# Patient Record
Sex: Female | Born: 1960
Health system: Southern US, Community
[De-identification: ages and names within clinical notes are randomized; demographics above are authoritative.]

## PROBLEM LIST (undated history)

## (undated) DIAGNOSIS — M1990 Unspecified osteoarthritis, unspecified site: Secondary | ICD-10-CM

## (undated) DIAGNOSIS — M2550 Pain in unspecified joint: Secondary | ICD-10-CM

## (undated) DIAGNOSIS — K219 Gastro-esophageal reflux disease without esophagitis: Secondary | ICD-10-CM

## (undated) DIAGNOSIS — E785 Hyperlipidemia, unspecified: Secondary | ICD-10-CM

## (undated) DIAGNOSIS — K589 Irritable bowel syndrome without diarrhea: Secondary | ICD-10-CM

## (undated) DIAGNOSIS — F419 Anxiety disorder, unspecified: Secondary | ICD-10-CM

## (undated) DIAGNOSIS — R7303 Prediabetes: Secondary | ICD-10-CM

## (undated) DIAGNOSIS — I10 Essential (primary) hypertension: Secondary | ICD-10-CM

## (undated) DIAGNOSIS — D219 Benign neoplasm of connective and other soft tissue, unspecified: Secondary | ICD-10-CM

## (undated) HISTORY — DX: Irritable bowel syndrome, unspecified: K58.9

## (undated) HISTORY — DX: Gastro-esophageal reflux disease without esophagitis: K21.9

## (undated) HISTORY — PX: WISDOM TOOTH EXTRACTION: SHX21

## (undated) HISTORY — DX: Anxiety disorder, unspecified: F41.9

## (undated) HISTORY — DX: Essential (primary) hypertension: I10

## (undated) HISTORY — PX: COLONOSCOPY: SHX174

## (undated) HISTORY — PX: EYE SURGERY: SHX253

## (undated) HISTORY — DX: Unspecified osteoarthritis, unspecified site: M19.90

## (undated) HISTORY — DX: Hyperlipidemia, unspecified: E78.5

## (undated) HISTORY — DX: Pain in unspecified joint: M25.50

## (undated) HISTORY — PX: UMBILICAL HERNIA REPAIR: SHX196

## (undated) HISTORY — PX: KNEE SURGERY: SHX244

## (undated) HISTORY — PX: TONSILLECTOMY: SUR1361

## (undated) HISTORY — PX: KNEE ARTHROSCOPY: SHX127

## (undated) HISTORY — DX: Prediabetes: R73.03

## (undated) HISTORY — DX: Benign neoplasm of connective and other soft tissue, unspecified: D21.9

---

## 2011-02-06 DIAGNOSIS — K219 Gastro-esophageal reflux disease without esophagitis: Secondary | ICD-10-CM | POA: Insufficient documentation

## 2014-12-28 DIAGNOSIS — M1990 Unspecified osteoarthritis, unspecified site: Secondary | ICD-10-CM | POA: Insufficient documentation

## 2014-12-29 DIAGNOSIS — K581 Irritable bowel syndrome with constipation: Secondary | ICD-10-CM | POA: Insufficient documentation

## 2015-01-01 DIAGNOSIS — E785 Hyperlipidemia, unspecified: Secondary | ICD-10-CM | POA: Insufficient documentation

## 2015-05-09 MED FILL — LINZESS 290 MCG CAPSULE: 290 | 30 days supply | Qty: 30 | Fill #0

## 2015-05-09 MED FILL — OMEPRAZOLE DR 20 MG CAPSULE: 20 | 90 days supply | Qty: 180 | Fill #0

## 2015-06-20 MED FILL — LINZESS 290 MCG CAPSULE: 290 | 30 days supply | Qty: 30 | Fill #1

## 2015-07-12 ENCOUNTER — Telehealth: Payer: Self-pay | Admitting: Gastroenterology

## 2015-07-31 MED FILL — LINZESS 290 MCG CAPSULE: 290 | 30 days supply | Qty: 30 | Fill #0

## 2015-08-03 NOTE — Telephone Encounter (Signed)
Received records. Pt is requesting Dr Silverio Decamp. Would like to be seen for a office visit. Pt relocated to this area.

## 2015-08-08 ENCOUNTER — Encounter: Payer: Self-pay | Admitting: Gastroenterology

## 2015-08-08 NOTE — Telephone Encounter (Signed)
Dr. Nandigam reviewed records and has accepted patient. Ok to schedule Direct Colon. Left message for patient to return my call.  °

## 2015-08-08 NOTE — Telephone Encounter (Signed)
Appointment scheduled.

## 2015-08-14 ENCOUNTER — Encounter: Payer: Self-pay | Admitting: Obstetrics and Gynecology

## 2015-08-23 ENCOUNTER — Ambulatory Visit (INDEPENDENT_AMBULATORY_CARE_PROVIDER_SITE_OTHER): Payer: 59 | Admitting: Obstetrics and Gynecology

## 2015-08-23 ENCOUNTER — Encounter: Payer: Self-pay | Admitting: Obstetrics and Gynecology

## 2015-08-23 VITALS — BP 122/80 | HR 80 | Resp 14 | Ht 64.5 in | Wt 228.0 lb

## 2015-08-23 DIAGNOSIS — Z91048 Other nonmedicinal substance allergy status: Secondary | ICD-10-CM

## 2015-08-23 DIAGNOSIS — Z9109 Other allergy status, other than to drugs and biological substances: Secondary | ICD-10-CM | POA: Insufficient documentation

## 2015-08-23 DIAGNOSIS — Z01419 Encounter for gynecological examination (general) (routine) without abnormal findings: Secondary | ICD-10-CM

## 2015-08-23 DIAGNOSIS — F411 Generalized anxiety disorder: Secondary | ICD-10-CM | POA: Insufficient documentation

## 2015-08-23 MED ORDER — CITALOPRAM HYDROBROMIDE 20 MG PO TABS: ORAL_TABLET | ORAL | 1 refills | Status: DC

## 2015-08-23 MED FILL — CITALOPRAM HBR 20 MG TABLET: 20 | 30 days supply | Qty: 30 | Fill #0

## 2015-08-23 NOTE — Patient Instructions (Signed)

## 2015-08-23 NOTE — Progress Notes (Signed)
55 y.o. [id number] MarriedAfrican AmericanF here for annual exam.  Perimenopausal, almost a year since her last cycle. No spotting. No vasomotor symptoms. She has a h/o a fibroid, she doesn't think her uterus is enlarged. Infrequently sexually active, husband has MS and ED. No vaginal dryness.  She has some anxiety, maybe has ADD (will discuss with her primary). Yesterday she had an allergic reaction to something, eyes swelled up. She took benadryl helped. In the past she was told she had allergies to mold    Patient's last menstrual period was 10/07/2014 (approximate).          Sexually active: Yes.    The current method of family planning is none.    Exercising: Yes.    just started- walking/stairs Smoker:  Former smoker  Health Maintenance: Pap:  10/19/14, negative, negative hpv History of abnormal Pap:  no MMG:  03/2015 WNL per patient ([place])  Colonoscopy:  2014 polyp - repeat in 5 years BMD:   Never TDaP:  Up to date  Gardasil: N/A   reports that she has quit smoking. Her smoking use included Cigarettes. She has a 4.00 pack-year smoking history. She has never used smokeless tobacco. She reports that she does not drink alcohol or use drugs. She works for [employer], in [place]. She moved here from [place]. 2 grown kids, one in [place], one in [place]. 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 Both musicians.  Former Licensed conveyancer.  Husband is a MD in [employer]  Past Medical History:  Diagnosis Date  . Acid reflux   . Fibroid   . Gastroesophageal reflux   . Hypertension   . IBS (irritable bowel syndrome)   . Osteoarthritis    both knees    Past Surgical History:  Procedure Laterality Date  . CESAREAN SECTION    . EYE SURGERY Right   . KNEE ARTHROSCOPY Right   . UMBILICAL HERNIA REPAIR    . WISDOM TOOTH EXTRACTION      Current Outpatient Prescriptions  Medication Sig Dispense Refill  . LINZESS 290 MCG CAPS capsule   4  . omeprazole (PRILOSEC) 20 MG capsule Take by mouth.     No current  facility-administered medications for this visit.     Family History  Problem Relation Age of Onset  . Hyperlipidemia Mother   . Hypertension Mother   . Stroke Sister   . Heart disease Maternal Grandmother   She had HTN in her last job, improved with reduction in stress.   Review of Systems  Constitutional: Negative.   HENT: Negative.   Eyes: Negative.   Respiratory: Negative.   Cardiovascular: Negative.   Gastrointestinal: Negative.   Endocrine: Negative.   Genitourinary: Negative.   Musculoskeletal: Negative.   Skin: Negative.   Allergic/Immunologic: Negative.   Neurological: Negative.   Psychiatric/Behavioral: Negative.     Exam:   BP 122/80 (BP Location: Right Arm, Patient Position: Sitting, Cuff Size: Normal)   Pulse 80   Resp 14   Ht 5' 4.5" (1.638 m)   Wt 228 lb (103.4 kg)   LMP 10/07/2014 (Approximate)   BMI 38.53 kg/m   Weight change: @WEIGHTCHANGE @ Height:   Height: 5' 4.5" (163.8 cm)  Ht Readings from Last 3 Encounters:  08/23/15 5' 4.5" (1.638 m)    General appearance: alert, cooperative and appears stated age Head: Normocephalic, without obvious abnormality, atraumatic Neck: no adenopathy, supple, symmetrical, trachea midline and thyroid normal to inspection and palpation Lungs: clear to auscultation bilaterally Breasts: normal appearance, no masses or  tenderness Heart: regular rate and rhythm Abdomen: soft, non-tender; bowel sounds normal; no masses,  no organomegaly Extremities: extremities normal, atraumatic, no cyanosis or edema Skin: Skin color, texture, turgor normal. No rashes or lesions Lymph nodes: Cervical, supraclavicular, and axillary nodes normal. No abnormal inguinal nodes palpated Neurologic: Grossly normal   Pelvic: External genitalia:  no lesions              Urethra:  normal appearing urethra with no masses, tenderness or lesions              Bartholins and Skenes: normal                 Vagina: normal appearing vagina with  normal color and discharge, no lesions              Cervix: no lesions               Bimanual Exam:  Uterus:  normal size, contour, position, consistency, mobility, non-tender              Adnexa: no mass, fullness, tenderness               Rectovaginal: Confirms               Anus:  normal sphincter tone, no lesions  Chaperone was present for exam.  A:  Well Woman with normal exam  Pre-diabetes, f/u with primary  Anxiety  P:   No pap this year  Mammogram UTD  Colonoscopy UTD  Discussed breast self exam  Discussed calcium and vit D intake  She is due for lab work with her primary in 12/17  Discussed options of counseling or medication, she would like medication  Start Celexa, f/u in one month

## 2015-08-24 ENCOUNTER — Encounter: Payer: Self-pay | Admitting: Obstetrics and Gynecology

## 2015-08-24 NOTE — Telephone Encounter (Signed)
Routing to provider for final review. Will close encounter.     

## 2015-09-17 ENCOUNTER — Telehealth: Payer: Self-pay | Admitting: Obstetrics and Gynecology

## 2015-09-17 NOTE — Telephone Encounter (Signed)
Patient cancelled 4 week fu appointment and did not wish to reschedule at this time.

## 2015-09-20 ENCOUNTER — Ambulatory Visit: Payer: 59 | Admitting: Obstetrics and Gynecology

## 2015-10-05 ENCOUNTER — Encounter: Payer: Self-pay | Admitting: Nurse Practitioner

## 2015-10-05 ENCOUNTER — Ambulatory Visit (INDEPENDENT_AMBULATORY_CARE_PROVIDER_SITE_OTHER): Payer: 59 | Admitting: Nurse Practitioner

## 2015-10-05 ENCOUNTER — Other Ambulatory Visit (INDEPENDENT_AMBULATORY_CARE_PROVIDER_SITE_OTHER): Payer: 59

## 2015-10-05 VITALS — BP 110/84 | HR 57 | Temp 98.7°F | Resp 16 | Ht 64.0 in | Wt 221.0 lb

## 2015-10-05 DIAGNOSIS — Z0000 Encounter for general adult medical examination without abnormal findings: Secondary | ICD-10-CM

## 2015-10-05 DIAGNOSIS — R739 Hyperglycemia, unspecified: Secondary | ICD-10-CM

## 2015-10-05 DIAGNOSIS — K219 Gastro-esophageal reflux disease without esophagitis: Secondary | ICD-10-CM | POA: Diagnosis not present

## 2015-10-05 DIAGNOSIS — K581 Irritable bowel syndrome with constipation: Secondary | ICD-10-CM

## 2015-10-05 LAB — LIPID PANEL
CHOL/HDL RATIO: 5
CHOLESTEROL: 201 mg/dL — AB (ref 0–200)
HDL: 40.2 mg/dL (ref >39.00)
LDL Cholesterol: 134 mg/dL — ABNORMAL HIGH (ref 0–99)
NonHDL: 160.52
TRIGLYCERIDES: 131 mg/dL (ref 0.0–149.0)
VLDL: 26.2 mg/dL (ref 0.0–40.0)

## 2015-10-05 LAB — BASIC METABOLIC PANEL
BUN: 19 mg/dL (ref 6–23)
CO2: 29 meq/L (ref 19–32)
Calcium: 8.6 mg/dL (ref 8.4–10.5)
Chloride: 106 mEq/L (ref 96–112)
Creatinine, Ser: 0.99 mg/dL (ref 0.40–1.20)
GFR: 62.21 mL/min (ref >60.00)
GLUCOSE: 92 mg/dL (ref 70–99)
POTASSIUM: 4.1 meq/L (ref 3.5–5.1)
SODIUM: 140 meq/L (ref 135–145)

## 2015-10-05 LAB — HEMOGLOBIN A1C: Hgb A1c MFr Bld: 5.9 % (ref 4.6–6.5)

## 2015-10-05 NOTE — Progress Notes (Signed)
Pre visit review using our clinic review tool, if applicable. No additional management support is needed unless otherwise documented below in the visit note. 

## 2015-10-05 NOTE — Progress Notes (Signed)
Subjective:    Patient ID: Elizabeth Brooks, female    DOB: [birth year], 55 y.o.   MRN: [id number]  Patient presents today for complete physical or establish care (new patient)  HPI  GERD: Controlled symptoms with omeprazole.    IBS-D: Uses linzess prn, bowel routine has improved with increase exercise and healthy diet/  Immunizations: (TDAP, Hep C screen, Pneumovax, Influenza, zoster)  Health Maintenance  Topic Date Due  . Colon Cancer Screening  02/16/2012  . Flu Shot  04/06/2016*  .  Hepatitis C: One time screening is recommended by Center for Disease Control  (CDC) for  adults born from [birth year] through 1965.   10/03/2017*  . HIV Screening  10/03/2017*  . Mammogram  10/23/2016  . Pap Smear  10/18/2017  . Tetanus Vaccine  01/14/2021  *Topic was postponed. The date shown is not the original due date.   Diet: started healthy diet Weight:  Wt Readings from Last 3 Encounters:  10/05/15 221 lb (100.2 kg)  08/23/15 228 lb (103.4 kg)   Exercise:walking daily Home Safety: home with husband. Depression/Suicide:denies No flowsheet data found. No flowsheet data found. Colonoscopy (every 5-84yrs, >50-38yrs):up to date, will bring records Pap Smear (every 33yrs for >21-29 without HPV, every 102yrs for >30-52yrs with HPV):up to date Mammogram (yearly, >65yrs): due next month, patient will schedule Vision:up to date Dental:up to date Sexual History (birth control, marital status, STD):married, has 2adult children, sexually active with husband, menopausal.  Medications and allergies reviewed with patient and updated if appropriate.  Patient Active Problem List   Diagnosis Date Noted  . Generalized anxiety disorder 08/23/2015  . Environmental allergies 08/23/2015  . Irritable bowel syndrome with constipation 12/29/2014  . Osteoarthritis 12/28/2014  . GERD (gastroesophageal reflux disease) 02/06/2011    Current Outpatient Prescriptions on File Prior to Visit  Medication Sig Dispense  Refill  . LINZESS 290 MCG CAPS capsule   4  . omeprazole (PRILOSEC) 20 MG capsule Take by mouth.     No current facility-administered medications on file prior to visit.     Past Medical History:  Diagnosis Date  . Acid reflux   . Fibroid   . Gastroesophageal reflux   . Hypertension   . IBS (irritable bowel syndrome)   . Osteoarthritis    both knees  . Prediabetes     Past Surgical History:  Procedure Laterality Date  . CESAREAN SECTION    . EYE SURGERY Right   . KNEE ARTHROSCOPY Right   . UMBILICAL HERNIA REPAIR    . WISDOM TOOTH EXTRACTION      Social History   Social History  . Marital status: Married    Spouse name: N/A  . Number of children: N/A  . Years of education: N/A   Social History Main Topics  . Smoking status: Former Smoker    Packs/day: 1.00    Years: 4.00    Types: Cigarettes  . Smokeless tobacco: Never Used  . Alcohol use No  . Drug use: No  . Sexual activity: Yes    Partners: Male    Birth control/ protection: None   Other Topics Concern  . None   Social History Narrative  . None    Family History  Problem Relation Age of Onset  . Hyperlipidemia Mother   . Hypertension Mother   . Stroke Sister   . Heart disease Maternal Grandmother         Review of Systems  Constitutional: Negative for fever, malaise/fatigue and weight loss.  HENT: Negative for congestion and sore throat.   Eyes:       Negative for visual changes  Respiratory: Negative for cough and shortness of breath.   Cardiovascular: Negative for chest pain, palpitations and leg swelling.  Gastrointestinal: Negative for blood in stool, constipation, diarrhea and heartburn.  Genitourinary: Negative for dysuria, frequency and urgency.  Musculoskeletal: Negative for falls, joint pain and myalgias.  Skin: Negative for rash.  Neurological: Negative for dizziness, sensory change and headaches.  Endo/Heme/Allergies: Does not bruise/bleed easily.  Psychiatric/Behavioral:  Negative for depression, substance abuse and suicidal ideas. The patient is not nervous/anxious.     Objective:   Vitals:   10/05/15 0916  BP: 110/84  Pulse: (!) 57  Resp: 16  Temp: 98.7 F (37.1 C)    Body mass index is 37.93 kg/m.   Physical Examination:  Physical Exam  Constitutional: She is oriented to person, place, and time and well-developed, well-nourished, and in no distress. No distress.  HENT:  Right Ear: External ear normal.  Left Ear: External ear normal.  Nose: Nose normal.  Mouth/Throat: No oropharyngeal exudate.  Eyes: Conjunctivae and EOM are normal. Pupils are equal, round, and reactive to light. No scleral icterus.  Neck: Normal range of motion. Neck supple. No thyromegaly present.  Cardiovascular: Normal rate, regular rhythm, normal heart sounds and intact distal pulses.   Pulmonary/Chest: Effort normal and breath sounds normal.  Abdominal: Soft. Bowel sounds are normal. She exhibits no distension. There is no tenderness.  Musculoskeletal: Normal range of motion. She exhibits no edema or tenderness.  Lymphadenopathy:    She has no cervical adenopathy.  Neurological: She is alert and oriented to person, place, and time. Gait normal.  Skin: Skin is warm and dry.  Psychiatric: Affect and judgment normal.  Vitals reviewed.   ASSESSMENT and PLAN:  Elizabeth Brooks was seen today for establish care.  Diagnoses and all orders for this visit:  Encounter for preventive health examination -     Lipid panel -     Basic Metabolic Panel (BMET); Future  Irritable bowel syndrome with constipation  Gastroesophageal reflux disease without esophagitis  Hyperglycemia -     Hemoglobin A1c; Future    No problem-specific Assessment & Plan notes found for this encounter.     Follow up: Return in about 1 year (around 10/04/2016) for CPE.  Wilfred Lacy, NP

## 2015-10-08 NOTE — Progress Notes (Signed)
Normal BMP. Hemoglobin A1c indicates prediabetes. Cholesterol panel indicates elevated total cholesterol and LDL. Encourage heart healthy diet and regular daily exercise. Return to office in 6 months instead of one year.

## 2015-10-17 NOTE — Progress Notes (Signed)
[facility] [address] [place], [place] [address] Phone: [phone] Subjective:    I'm seeing this patient by the request  of:  Wilfred Lacy, NP   CC: Bilateral knee pain  [id number]  Elizabeth Brooks is a 55 y.o. female coming in with complaint of bilateral knee pain. Patient states that this is been a long acting problem. Has been told that she has osteophytic changes of the knees bilaterally. Patient states she is seen other providers for this previously. Patient though is looking to be more active. Likes to run. Finds that the knee pain is severe afterwards. States that since mostly on the anterior aspect of the knees. Denies any giving out on her or any instability. States that it is more of a soreness. Rates the severity of pain a 6 out of 10. Has been walking more frequently and even this activity seems to get some discomfort.     Past Medical History:  Diagnosis Date  . Acid reflux   . Fibroid   . Gastroesophageal reflux   . Hypertension   . IBS (irritable bowel syndrome)   . Osteoarthritis    both knees  . Prediabetes    Past Surgical History:  Procedure Laterality Date  . CESAREAN SECTION    . EYE SURGERY Right   . KNEE ARTHROSCOPY Right   . UMBILICAL HERNIA REPAIR    . WISDOM TOOTH EXTRACTION     Social History   Social History  . Marital status: Married    Spouse name: N/A  . Number of children: N/A  . Years of education: N/A   Social History Main Topics  . Smoking status: Former Smoker    Packs/day: 1.00    Years: 4.00    Types: Cigarettes  . Smokeless tobacco: Never Used  . Alcohol use No  . Drug use: No  . Sexual activity: Yes    Partners: Male    Birth control/ protection: None   Other Topics Concern  . None   Social History Narrative  . None   Allergies  Allergen Reactions  . Penicillins Anaphylaxis and Rash   Family History  Problem Relation Age of Onset  . Hyperlipidemia Mother   . Hypertension  Mother   . Stroke Sister   . Heart disease Maternal Grandmother     Past medical history, social, surgical and family history all reviewed in electronic medical record.  No pertanent information unless stated regarding to the chief complaint.   Review of Systems: No headache, visual changes, nausea, vomiting, diarrhea, constipation, dizziness, abdominal pain, skin rash, fevers, chills, night sweats, weight loss, swollen lymph nodes, body aches, joint swelling, muscle aches, chest pain, shortness of breath, mood changes.   Objective  Blood pressure 126/80, pulse 62, weight 220 lb (99.8 kg), SpO2 97 %.  General: No apparent distress alert and oriented x3 mood and affect normal, dressed appropriately.  HEENT: Pupils equal, extraocular movements intact  Respiratory: Patient's speak in full sentences and does not appear short of breath  Cardiovascular: No lower extremity edema, non tender, no erythema  Skin: Warm dry intact with no signs of infection or rash on extremities or on axial skeleton.  Abdomen: Soft nontender  Neuro: Cranial nerves II through XII are intact, neurovascularly intact in all extremities with 2+ DTRs and 2+ pulses.  Lymph: No lymphadenopathy of posterior or anterior cervical chain or axillae bilaterally.  Gait normal with good balance and coordination.  MSK:  Non tender with  full range of motion and good stability and symmetric strength and tone of shoulders, elbows, wrist, hip and ankles bilaterally.  Knee: bilateral. Mild valgus deformity of the knees bilaterally right greater than left Tender to palpation mostly over the patellofemoral ROM full in flexion and extension and lower leg rotation. Ligaments with solid consistent endpoints including ACL, PCL, LCL, MCL. Negative Mcmurray's, Apley's, and Thessalonian tests. painful patellar compression. Patellar glide with mild crepitus. Patellar and quadriceps tendons unremarkable. Hamstring and quadriceps strength is  normal.  Severe pes planus with overpronation of the foot left greater than right.  Procedure note [id number]; 15 minutes spent for Therapeutic exercises as stated in above notes.  This included exercises focusing on stretching, strengthening, with significant focus on eccentric aspects. Patellofemoral Syndrome  Reviewed anatomy using anatomical model and how PFS occurs.  Given rehab exercises handout for VMO, hip abductors, core, entire kinetic chain including proprioception exercises including cone touches, step downs, hip elevations and turn outs.  Could benefit from PT, regular exercise, upright biking, and a PFS knee brace to assist with tracking abnormalities.   Proper technique shown and discussed handout in great detail with ATC.  All questions were discussed and answered.     Impression and Recommendations:     This case required medical decision making of moderate complexity.      Note: This dictation was prepared with Dragon dictation along with smaller phrase technology. Any transcriptional errors that result from this process are unintentional.

## 2015-10-18 ENCOUNTER — Encounter: Payer: Self-pay | Admitting: Family Medicine

## 2015-10-18 ENCOUNTER — Ambulatory Visit (INDEPENDENT_AMBULATORY_CARE_PROVIDER_SITE_OTHER): Payer: 59 | Admitting: Family Medicine

## 2015-10-18 DIAGNOSIS — M222X2 Patellofemoral disorders, left knee: Secondary | ICD-10-CM | POA: Diagnosis not present

## 2015-10-18 DIAGNOSIS — M222X1 Patellofemoral disorders, right knee: Secondary | ICD-10-CM | POA: Diagnosis not present

## 2015-10-18 DIAGNOSIS — M2141 Flat foot [pes planus] (acquired), right foot: Secondary | ICD-10-CM | POA: Diagnosis not present

## 2015-10-18 DIAGNOSIS — M2142 Flat foot [pes planus] (acquired), left foot: Secondary | ICD-10-CM | POA: Diagnosis not present

## 2015-10-18 MED ORDER — DICLOFENAC SODIUM 2 % TD SOLN: 2.0000 "application " | Freq: Two times a day (BID) | TRANSDERMAL | 3 refills | Status: DC

## 2015-10-18 NOTE — Patient Instructions (Signed)
Good to see you  Ice 20 minutes 2 times daily. Usually after activity and before bed. pennsaid pinkie amount topically 2 times daily as needed.  Spenco orthotics "total support" online would be great  Good shoes with rigid bottom.  Elizabeth Brooks, Merrell or New balance greater then 700, also consider allergia or xerelo.  Vitamin D 2000 IU daily  Turmeric 500mg  twice daily Tart cherry extract at night See me again in 4 weeks.

## 2015-10-19 ENCOUNTER — Encounter: Payer: Self-pay | Admitting: Nurse Practitioner

## 2015-10-19 DIAGNOSIS — M2140 Flat foot [pes planus] (acquired), unspecified foot: Secondary | ICD-10-CM | POA: Insufficient documentation

## 2015-10-19 DIAGNOSIS — M222X1 Patellofemoral disorders, right knee: Secondary | ICD-10-CM | POA: Insufficient documentation

## 2015-10-19 DIAGNOSIS — M222X2 Patellofemoral disorders, left knee: Secondary | ICD-10-CM | POA: Insufficient documentation

## 2015-10-19 NOTE — Assessment & Plan Note (Signed)
Recommended over-the-counter orthotics. Patient may be a candidate for custom orthotics if necessary.

## 2015-10-19 NOTE — Assessment & Plan Note (Signed)
Patient does have patellofemoral syndrome bilaterally. We discussed with patient about the mild instability of the knees as well. Patient will do more crosstraining, given home exercises with athletic trainer, we discussed icing regimen. Discussed proper shoes that I think will also help with alignment. Patient come back again in 4 weeks and if worsening symptoms we'll consider injection and formal physical therapy.

## 2015-10-25 ENCOUNTER — Other Ambulatory Visit: Payer: Self-pay | Admitting: Obstetrics and Gynecology

## 2015-10-25 DIAGNOSIS — Z1231 Encounter for screening mammogram for malignant neoplasm of breast: Secondary | ICD-10-CM

## 2015-10-29 ENCOUNTER — Telehealth: Payer: Self-pay | Admitting: Gastroenterology

## 2015-10-29 ENCOUNTER — Ambulatory Visit: Payer: Self-pay | Admitting: Gastroenterology

## 2015-10-29 NOTE — Telephone Encounter (Signed)
No charge. 

## 2015-10-31 ENCOUNTER — Encounter: Payer: Self-pay | Admitting: Gastroenterology

## 2015-10-31 ENCOUNTER — Ambulatory Visit (INDEPENDENT_AMBULATORY_CARE_PROVIDER_SITE_OTHER): Payer: 59 | Admitting: Gastroenterology

## 2015-10-31 VITALS — BP 118/76 | HR 80 | Ht 64.0 in | Wt 219.0 lb

## 2015-10-31 DIAGNOSIS — K5900 Constipation, unspecified: Secondary | ICD-10-CM

## 2015-10-31 DIAGNOSIS — D219 Benign neoplasm of connective and other soft tissue, unspecified: Secondary | ICD-10-CM | POA: Insufficient documentation

## 2015-10-31 DIAGNOSIS — K219 Gastro-esophageal reflux disease without esophagitis: Secondary | ICD-10-CM

## 2015-10-31 MED ORDER — LINACLOTIDE 72 MCG PO CAPS: 72.0000 ug | ORAL_CAPSULE | Freq: Every day | ORAL | 3 refills | Status: DC

## 2015-10-31 MED ORDER — FAMOTIDINE 40 MG PO TABS: 40.0000 mg | ORAL_TABLET | Freq: Every day | ORAL | 6 refills | Status: DC

## 2015-10-31 MED ORDER — OMEPRAZOLE 20 MG PO CPDR: 20.0000 mg | DELAYED_RELEASE_CAPSULE | Freq: Two times a day (BID) | ORAL | 3 refills | Status: DC

## 2015-10-31 MED FILL — OMEPRAZOLE DR 20 MG CAPSULE: 20 | 90 days supply | Qty: 180 | Fill #0

## 2015-10-31 MED FILL — LINZESS 72 MCG CAPSULE: 72 | 30 days supply | Qty: 30 | Fill #0

## 2015-10-31 NOTE — Patient Instructions (Signed)
Your recall colonoscopy will be in July 2019, we will send out a reminder letter  We have refilled Omeprazole today   We have sent Linzess and Pepcid to your pharmacy today  Use Benefiber 1 tablespoon three times a day with meals  Follow up in 3-6 months

## 2015-11-02 NOTE — Progress Notes (Signed)
Elizabeth Brooks    [id number]    10-24-1960  Primary Care Physician:Charlotte [name], NP  Referring Physician: Flossie Buffy, NP [place] [phone] [place], [place] [address]  Chief complaint: GERD, surveillance colonsocopy  HPI: 55 yr F recently moved to this are is here to establish care. Last colonoscopy in July 2014 with removal 3 mm polyp in ascending colon, was lymphoid aggregate, no evidence of adenoma or dysplasia. She also has small internal hemorrhoids. Patient has history of chronic GERD on omeprazole with good symptom control with occasional break through at bedtime. She also has history of chronic constipation was prescribed Linzess 290 g by PMD but patient hasn't started taking it. Denies any nausea, vomiting, abdominal pain, melena or bright red blood per rectum    Outpatient Encounter Prescriptions as of 10/31/2015  Medication Sig  . Cholecalciferol (VITAMIN D) 2000 units CAPS Take 1 capsule by mouth daily.  . Diclofenac Sodium (PENNSAID) 2 % SOLN Place 2 application onto the skin 2 (two) times daily.  . Misc Natural Products (TART CHERRY ADVANCED PO) Take 1 capsule by mouth daily.  . Omega-3 Fatty Acids (FISH OIL) 1200 MG CAPS Take 1 capsule by mouth daily.  Marland Kitchen omeprazole (PRILOSEC) 20 MG capsule Take 1 capsule (20 mg total) by mouth 2 (two) times daily before a meal.  . TURMERIC PO Take 1 capsule by mouth 2 (two) times daily.  . [DISCONTINUED] LINZESS 290 MCG CAPS capsule Take 290 mcg by mouth daily as needed.   . [DISCONTINUED] omeprazole (PRILOSEC) 20 MG capsule Take 20 mg by mouth 2 (two) times daily before a meal.   . famotidine (PEPCID) 40 MG tablet Take 1 tablet (40 mg total) by mouth at bedtime.  Marland Kitchen linaclotide (LINZESS) 72 MCG capsule Take 1 capsule (72 mcg total) by mouth daily before breakfast.   No facility-administered encounter medications on file as of 10/31/2015.     Allergies as of 10/31/2015 - Review Complete 10/31/2015    Allergen Reaction Noted  . Penicillins Anaphylaxis and Rash 02/13/2010    Past Medical History:  Diagnosis Date  . Fibroid   . Gastroesophageal reflux   . Hyperlipidemia   . Hypertension   . IBS (irritable bowel syndrome)   . Osteoarthritis    both knees  . Prediabetes     Past Surgical History:  Procedure Laterality Date  . CESAREAN SECTION     x 2  . EYE SURGERY Right   . KNEE ARTHROSCOPY Right   . KNEE SURGERY Right    removal of large cyst-non-cancerous  . TONSILLECTOMY    . UMBILICAL HERNIA REPAIR    . WISDOM TOOTH EXTRACTION      Family History  Problem Relation Age of Onset  . Hyperlipidemia Mother   . Hypertension Mother   . Stroke Sister   . Heart disease Maternal Grandmother   . Colon cancer Neg Hx     Social History   Social History  . Marital status: Married    Spouse name: N/A  . Number of children: 2  . Years of education: N/A   Occupational History  . 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 Smoking status: Former Smoker    Packs/day: 1.00    Years: 26.00    Types: Cigarettes  . Smokeless tobacco: Never Used  . Alcohol use No  . Drug use: No  . Sexual activity: Yes  Partners: Male    Birth control/ protection: None   Other Topics Concern  . Not on file   Social History Narrative  . No narrative on file      Review of systems: Review of Systems  Constitutional: Negative for fever and chills.  HENT: Negative.   Eyes: Negative for blurred vision.  Respiratory: Negative for cough, shortness of breath and wheezing.   Cardiovascular: Negative for chest pain and palpitations.  Gastrointestinal: as per HPI Genitourinary: Negative for dysuria, urgency, frequency and hematuria.  Musculoskeletal: Negative for myalgias, back pain and joint pain.  Skin: Negative for itching and rash.  Neurological: Negative for dizziness, tremors, focal weakness, seizures and loss of consciousness.  Endo/Heme/Allergies: Negative for  seasonal allergies.  Psychiatric/Behavioral: Negative for depression, suicidal ideas and hallucinations.  All other systems reviewed and are negative.   Physical Exam: Vitals:   10/31/15 0923  BP: 118/76  Pulse: 80   Body mass index is 37.59 kg/m. Gen:      No acute distress HEENT:  EOMI, sclera anicteric Neck:     No masses; no thyromegaly Lungs:    Clear to auscultation bilaterally; normal respiratory effort CV:         Regular rate and rhythm; no murmurs Abd:      + bowel sounds; soft, non-tender; no palpable masses, no distension Ext:    No edema; adequate peripheral perfusion Skin:      Warm and dry; no rash Neuro: alert and oriented x 3 Psych: normal mood and affect  Data Reviewed:  Reviewed labs, radiology imaging, old records and pertinent past GI work up Colonoscopy July 2014 with removal of small polyp and was recommended she repeat in 5 years  Assessment and Plan/Recommendations:  55 year old female with history of chronic GERD and constipation here to establish care GERD: Continue omeprazole 30 minutes before breakfast Use Pepcid 40 mg at bedtime as needed for breakthrough heartburn at bedtime  Constipation: We will do a trial of linzess 72 g daily and titrate up as needed based on response Benefiber 1 tablespoon 3 times daily with meals and advised patient to increase fluid intake  Due for recall colonoscopy in 2024  Return in 3-6 months or sooner if needed  Greater than 50% of the time used for counseling as well as treatment plan and follow-up. She had multiple questions which were answered to her satisfaction  K. Denzil Magnuson , MD [phone] Mon-Fri 8a-5p [phone] after 5p, weekends, holidays  CC: [name], [name], NP

## 2015-11-07 ENCOUNTER — Encounter: Payer: Self-pay | Admitting: Gastroenterology

## 2015-11-12 ENCOUNTER — Ambulatory Visit
Admission: RE | Admit: 2015-11-12 | Discharge: 2015-11-12 | Disposition: A | Discharge status: Home / Self Care | Payer: 59 | Source: Ambulatory Visit | Attending: Obstetrics and Gynecology | Admitting: Obstetrics and Gynecology

## 2015-11-12 DIAGNOSIS — Z1231 Encounter for screening mammogram for malignant neoplasm of breast: Secondary | ICD-10-CM | POA: Diagnosis not present

## 2015-11-14 ENCOUNTER — Ambulatory Visit: Payer: Self-pay

## 2015-11-14 ENCOUNTER — Ambulatory Visit: Payer: 59

## 2015-11-15 NOTE — Progress Notes (Signed)
[facility] [place], [place] [address] Phone: [phone] Subjective:    CC: Bilateral knee pain Follow-up  [id number]  Elizabeth Brooks is a 55 y.o. female coming in with complaint of bilateral knee pain. Found to have more of a patellofemoral arthritis as well as flatfeet. Patient was to get different shoes, home exercises, icing protocol. Patient given topical anti-inflammatories. Patient states She is making significant progress. Feels that the over-the-counter orthotics have not been beneficial. Has been doing the exercises but some along seemed to aggravate some of the knee pain. Has been awaiting running at this time but would like to start again in the near future. Is taking the over-the-counter medications with improvement.     Past Medical History:  Diagnosis Date  . Fibroid   . Gastroesophageal reflux   . Hyperlipidemia   . Hypertension   . IBS (irritable bowel syndrome)   . Osteoarthritis    both knees  . Prediabetes    Past Surgical History:  Procedure Laterality Date  . CESAREAN SECTION     x 2  . EYE SURGERY Right   . KNEE ARTHROSCOPY Right   . KNEE SURGERY Right    removal of large cyst-non-cancerous  . TONSILLECTOMY    . UMBILICAL HERNIA REPAIR    . WISDOM TOOTH EXTRACTION     Social History   Social History  . Marital status: Married    Spouse name: N/A  . Number of children: 2  . Years of education: N/A   Occupational History  . 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 Smoking status: Former Smoker    Packs/day: 1.00    Years: 26.00    Types: Cigarettes  . Smokeless tobacco: Never Used  . Alcohol use No  . Drug use: No  . Sexual activity: Yes    Partners: Male    Birth control/ protection: None   Other Topics Concern  . None   Social History Narrative  . None   Allergies  Allergen Reactions  . Penicillins Anaphylaxis and Rash   Family History  Problem Relation Age of Onset    . Hyperlipidemia Mother   . Hypertension Mother   . Stroke Sister   . Heart disease Maternal Grandmother   . Colon cancer Neg Hx     Past medical history, social, surgical and family history all reviewed in electronic medical record.  No pertanent information unless stated regarding to the chief complaint.   Review of Systems: No headache, visual changes, nausea, vomiting, diarrhea, constipation, dizziness, abdominal pain, skin rash, fevers, chills, night sweats, weight loss, swollen lymph nodes, body aches, joint swelling, muscle aches, chest pain, shortness of breath, mood changes.   Objective  Blood pressure 118/84, pulse 67, height 5\' 4"  (1.626 m), weight 219 lb (99.3 kg), SpO2 96 %.  Systems examined below as of 11/16/15 General: NAD A&O x3 mood, affect normal  HEENT: Pupils equal, extraocular movements intact no nystagmus Respiratory: not short of breath at rest or with speaking Cardiovascular: No lower extremity edema, non tender Skin: Warm dry intact with no signs of infection or rash on extremities or on axial skeleton. Abdomen: Soft nontender, no masses Neuro: Cranial nerves  intact, neurovascularly intact in all extremities with 2+ DTRs and 2+ pulses. Lymph: No lymphadenopathy appreciated today  Gait normal with good balance and coordination.  MSK: Non tender with full range of motion and good stability and symmetric  strength and tone of shoulders, elbows, wrist, hips and ankles bilaterally.  .  Knee: bilateral. Mild valgus deformity of the knees bilaterally right greater than left Mild tenderness over the patellofemoral ROM full in flexion and extension and lower leg rotation. Ligaments with solid consistent endpoints including ACL, PCL, LCL, MCL. Negative Mcmurray's, Apley's, and Thessalonian tests. painful patellar compression. Patellar glide with mild crepitus. Patellar and quadriceps tendons unremarkable. Hamstring and quadriceps strength is normal.  Severe pes  planus with overpronation of the foot left greater than right. Mild improvement from previous exam.     Impression and Recommendations:     This case required medical decision making of moderate complexity.      Note: This dictation was prepared with Dragon dictation along with smaller phrase technology. Any transcriptional errors that result from this process are unintentional.

## 2015-11-16 ENCOUNTER — Ambulatory Visit (INDEPENDENT_AMBULATORY_CARE_PROVIDER_SITE_OTHER): Payer: 59 | Admitting: Family Medicine

## 2015-11-16 ENCOUNTER — Encounter: Payer: Self-pay | Admitting: Family Medicine

## 2015-11-16 DIAGNOSIS — M222X2 Patellofemoral disorders, left knee: Secondary | ICD-10-CM | POA: Diagnosis not present

## 2015-11-16 DIAGNOSIS — M2141 Flat foot [pes planus] (acquired), right foot: Secondary | ICD-10-CM

## 2015-11-16 DIAGNOSIS — M222X1 Patellofemoral disorders, right knee: Secondary | ICD-10-CM

## 2015-11-16 DIAGNOSIS — M2142 Flat foot [pes planus] (acquired), left foot: Secondary | ICD-10-CM

## 2015-11-16 NOTE — Assessment & Plan Note (Signed)
Patient does have more of a patellofemoral syndrome bilaterally in the pes planus at think is contribute in. Is doing well with conservative therapy at this time and I do not think that injections are necessary. If worsening symptoms we will consider it. We discussed icing regimen. Discussed continuing the home exercises, we discussed the importance of weight loss. Follow-up again in 4-6 weeks

## 2015-11-16 NOTE — Assessment & Plan Note (Signed)
Encouraged patient to continue the over-the-counter orthotics. If continuing have trouble we'll consider possible custom orthotics.

## 2015-11-16 NOTE — Patient Instructions (Signed)
Great to see you  Happy holidays!  You are doing great  Starting after [place] day Start a walk-run progression: 3 times a week max.  - Initially start one minute walking than one minute running for 20 mins in the first week,   then 25 mins during the second week, then 30 mins afterwards.  Once you have reached 30 mins: - Run 2 mins, then walk 1 min.each week go to the next level -Then run 3 mins, and walk 1 min. -Then run 4 mins, and walk 1 min. -Then run 5 mins, and walk 1 min. -Slowly build up weekly to running 30 mins nonstop.  If painful at any of the steps, back up one step. COntinue the supplements Love the insoles and the shoes See me again in 6 weeks to make sure you are doing well!

## 2015-11-19 ENCOUNTER — Encounter: Payer: 59 | Attending: Nurse Practitioner | Admitting: Skilled Nursing Facility1

## 2015-11-19 ENCOUNTER — Encounter: Payer: Self-pay | Admitting: Skilled Nursing Facility1

## 2015-11-19 DIAGNOSIS — Z713 Dietary counseling and surveillance: Secondary | ICD-10-CM | POA: Insufficient documentation

## 2015-11-19 NOTE — Patient Instructions (Addendum)
-  As things get easier change them up (workouts)  -Try to eat within an hour to hour and a half of waking  -Try not to wait more than 5-6 hours without eating  -Try to add a little fruit or granola to your greek yogurt in the morning  -When you eat fruit have protein with it  -Work on trying more vegetables: start out with carrots and try them all different ways   -Try carrots and humus    -Get experimental with a smoothie: Like carrots, banana, and peanut butter   -Aim for 1400-1600 calories a day  -Listen to your hunger/fullness cues: eat when you are hungry and stop when you are satisfied  -Eat at LEAST 3 meals a day  -some days you will be hungrier than others and that is okay

## 2015-11-19 NOTE — Progress Notes (Signed)
  Medical Nutrition Therapy:  Appt start time: 11:34 end time:  12:04   Assessment:  Primary concerns today: [employer] employee. Pt states she is a very picky eater and wants a meal plan. Pt states she has tried some diets to lose wt: physicians for wt loss included. Pt states she has recently started working out more. Pt states she Will start personal training soon.   Preferred Learning Style:   No preference indicated   Learning Readiness:   Contemplating  MEDICATIONS: See list   DIETARY INTAKE:  Usual eating pattern includes 2 meals and 1 snacks per day.  Everyday foods include none stated.  Avoided foods include none stated.    24-hr recall:  B ( AM): yogurt Snk ( AM): cheese stick or grapes L ( PM): [place] wrap-----tuna crackers Snk ( PM): nutrigran bar----fiber one bar D ( PM): soup----chicken and vegetables  Snk ( PM):  Beverages: water, hot tea *meals outside the home: 2 Usual physical activity: walking, eliptical   Estimated energy needs: 1500 calories 170 g carbohydrates 112 g protein 42 g fat  Progress Towards Goal(s):  In progress.   Nutritional Diagnosis:  NB-1.1 Food and nutrition-related knowledge deficit As related to no prior nutrition education from a nutrition professional.  As evidenced by pt report, 24 hr recall, and BMI 37.57    Intervention:  Nutrition counseling for wt loss. Dietitian educated the pt on balanced meals and caloric needs. Goals: -As things get easier change them up (workouts)  -Try to eat within an hour to hour and a half of waking  -Try not to wait more than 5-6 hours without eating  -Try to add a little fruit or granola to your greek yogurt in the morning  -When you eat fruit have protein with it  -Work on trying more vegetables: start out with carrots and try them all different ways   -Try carrots and humus    -Get experimental with a smoothie: Like carrots, banana, and peanut butter   -Aim for 1400-1600  calories a day  -Listen to your hunger/fullness cues: eat when you are hungry and stop when you are satisfied  -Eat at LEAST 3 meals a day  -some days you will be hungrier than others and that is okay  Teaching Method Utilized:  Visual Auditory Hands on  Barriers to learning/adherence to lifestyle change: none identified  Demonstrated degree of understanding via:  Teach Back   Monitoring/Evaluation:  Dietary intake, exercise, and body weight prn.

## 2016-02-21 MED FILL — LINZESS 72 MCG CAPSULE: 72 | 30 days supply | Qty: 30 | Fill #1

## 2016-04-01 ENCOUNTER — Encounter: Payer: Self-pay | Admitting: Gastroenterology

## 2016-04-02 ENCOUNTER — Other Ambulatory Visit: Payer: Self-pay

## 2016-04-02 MED ORDER — LINACLOTIDE 290 MCG PO CAPS: 290.0000 ug | ORAL_CAPSULE | Freq: Every day | ORAL | 3 refills | Status: DC

## 2016-04-03 MED FILL — LINZESS 290 MCG CAPSULE: 290 | 30 days supply | Qty: 30 | Fill #0

## 2016-05-02 DIAGNOSIS — H04123 Dry eye syndrome of bilateral lacrimal glands: Secondary | ICD-10-CM | POA: Diagnosis not present

## 2016-05-02 DIAGNOSIS — H5213 Myopia, bilateral: Secondary | ICD-10-CM | POA: Diagnosis not present

## 2016-05-02 DIAGNOSIS — H524 Presbyopia: Secondary | ICD-10-CM | POA: Diagnosis not present

## 2016-05-02 DIAGNOSIS — H52223 Regular astigmatism, bilateral: Secondary | ICD-10-CM | POA: Diagnosis not present

## 2016-05-13 MED FILL — LINZESS 290 MCG CAPSULE: 290 | 30 days supply | Qty: 30 | Fill #1

## 2016-05-13 MED FILL — OMEPRAZOLE DR 20 MG CAPSULE: 20 | 90 days supply | Qty: 180 | Fill #1

## 2016-06-06 ENCOUNTER — Encounter: Payer: Self-pay | Admitting: Nurse Practitioner

## 2016-06-06 ENCOUNTER — Ambulatory Visit (INDEPENDENT_AMBULATORY_CARE_PROVIDER_SITE_OTHER): Payer: 59 | Admitting: Nurse Practitioner

## 2016-06-06 VITALS — BP 120/88 | HR 55 | Temp 98.5°F | Ht 64.0 in | Wt 193.0 lb

## 2016-06-06 DIAGNOSIS — Z6833 Body mass index (BMI) 33.0-33.9, adult: Secondary | ICD-10-CM

## 2016-06-06 DIAGNOSIS — E669 Obesity, unspecified: Secondary | ICD-10-CM | POA: Diagnosis not present

## 2016-06-06 MED ORDER — PHENTERMINE HCL 37.5 MG PO TABS: 37.5000 mg | ORAL_TABLET | Freq: Every day | ORAL | 0 refills | Status: DC

## 2016-06-06 MED FILL — PHENTERMINE 37.5 MG TABLET: 37.5 | 30 days supply | Qty: 30 | Fill #0

## 2016-06-06 NOTE — Patient Instructions (Signed)
Contrave, Qsymia, Belviq, phentermine.  Exercising to Lose Weight Exercising can help you to lose weight. In order to lose weight through exercise, you need to do vigorous-intensity exercise. You can tell that you are exercising with vigorous intensity if you are breathing very hard and fast and cannot hold a conversation while exercising. Moderate-intensity exercise helps to maintain your current weight. You can tell that you are exercising at a moderate level if you have a higher heart rate and faster breathing, but you are still able to hold a conversation. How often should I exercise? Choose an activity that you enjoy and set realistic goals. Your health care provider can help you to make an activity plan that works for you. Exercise regularly as directed by your health care provider. This may include:  Doing resistance training twice each week, such as: ? Push-ups. ? Sit-ups. ? Lifting weights. ? Using resistance bands.  Doing a given intensity of exercise for a given amount of time. Choose from these options: ? 150 minutes of moderate-intensity exercise every week. ? 75 minutes of vigorous-intensity exercise every week. ? A mix of moderate-intensity and vigorous-intensity exercise every week.  Children, pregnant women, people who are out of shape, people who are overweight, and older adults may need to consult a health care provider for individual recommendations. If you have any sort of medical condition, be sure to consult your health care provider before starting a new exercise program. What are some activities that can help me to lose weight?  Walking at a rate of at least 4.5 miles an hour.  Jogging or running at a rate of 5 miles per hour.  Biking at a rate of at least 10 miles per hour.  Lap swimming.  Roller-skating or in-line skating.  Cross-country skiing.  Vigorous competitive sports, such as football, basketball, and soccer.  Jumping rope.  Aerobic  dancing. How can I be more active in my day-to-day activities?  Use the stairs instead of the elevator.  Take a walk during your lunch break.  If you drive, park your car farther away from work or school.  If you take public transportation, get off one stop early and walk the rest of the way.  Make all of your phone calls while standing up and walking around.  Get up, stretch, and walk around every 30 minutes throughout the day. What guidelines should I follow while exercising?  Do not exercise so much that you hurt yourself, feel dizzy, or get very short of breath.  Consult your health care provider prior to starting a new exercise program.  Wear comfortable clothes and shoes with good support.  Drink plenty of water while you exercise to prevent dehydration or heat stroke. Body water is lost during exercise and must be replaced.  Work out until you breathe faster and your heart beats faster. This information is not intended to replace advice given to you by your health care provider. Make sure you discuss any questions you have with your health care provider. Document Released: 01/25/2010 Document Revised: 05/31/2015 Document Reviewed: 05/26/2013 Elsevier Interactive Patient Education  2018 [employer] [employer] Calories are units of energy. Your body needs a certain amount of calories from food to keep you going throughout the day. When you eat more calories than your body needs, your body stores the extra calories as fat. When you eat fewer calories than your body needs, your body burns fat to get the energy it needs.  Calorie counting means keeping track of how many calories you eat and drink each day. Calorie counting can be helpful if you need to lose weight. If you make sure to eat fewer calories than your body needs, you should lose weight. Ask your health care provider what a healthy weight is for you. For calorie counting to work, you will need  to eat the right number of calories in a day in order to lose a healthy amount of weight per week. A dietitian can help you determine how many calories you need in a day and will give you suggestions on how to reach your calorie goal.  A healthy amount of weight to lose per week is usually 1-2 lb (0.5-0.9 kg). This usually means that your daily calorie intake should be reduced by 500-750 calories.  Eating 1,200 - 1,500 calories per day can help most women lose weight.  Eating 1,500 - 1,800 calories per day can help most men lose weight.  What is my plan? My goal is to have __________ calories per day. If I have this many calories per day, I should lose around __________ pounds per week. What do I need to know about calorie counting? In order to meet your daily calorie goal, you will need to:  Find out how many calories are in each food you would like to eat. Try to do this before you eat.  Decide how much of the food you plan to eat.  Write down what you ate and how many calories it had. Doing this is called keeping a food log.  To successfully lose weight, it is important to balance calorie counting with a healthy lifestyle that includes regular activity. Aim for 150 minutes of moderate exercise (such as walking) or 75 minutes of vigorous exercise (such as running) each week. Where do I find calorie information?  The number of calories in a food can be found on a Nutrition Facts label. If a food does not have a Nutrition Facts label, try to look up the calories online or ask your dietitian for help. Remember that calories are listed per serving. If you choose to have more than one serving of a food, you will have to multiply the calories per serving by the amount of servings you plan to eat. For example, the label on a package of bread might say that a serving size is 1 slice and that there are 90 calories in a serving. If you eat 1 slice, you will have eaten 90 calories. If you eat 2  slices, you will have eaten 180 calories. How do I keep a food log? Immediately after each meal, record the following information in your food log:  What you ate. Don't forget to include toppings, sauces, and other extras on the food.  How much you ate. This can be measured in cups, ounces, or number of items.  How many calories each food and drink had.  The total number of calories in the meal.  Keep your food log near you, such as in a small notebook in your pocket, or use a mobile app or website. Some programs will calculate calories for you and show you how many calories you have left for the day to meet your goal. What are some calorie counting tips?  Use your calories on foods and drinks that will fill you up and not leave you hungry: ? Some examples of foods that fill you up are nuts and nut butters, vegetables, lean  proteins, and high-fiber foods like whole grains. High-fiber foods are foods with more than 5 g fiber per serving. ? Drinks such as sodas, specialty coffee drinks, alcohol, and juices have a lot of calories, yet do not fill you up.  Eat nutritious foods and avoid empty calories. Empty calories are calories you get from foods or beverages that do not have many vitamins or protein, such as candy, sweets, and soda. It is better to have a nutritious high-calorie food (such as an avocado) than a food with few nutrients (such as a bag of chips).  Know how many calories are in the foods you eat most often. This will help you calculate calorie counts faster.  Pay attention to calories in drinks. Low-calorie drinks include water and unsweetened drinks.  Pay attention to nutrition labels for "low fat" or "fat free" foods. These foods sometimes have the same amount of calories or more calories than the full fat versions. They also often have added sugar, starch, or salt, to make up for flavor that was removed with the fat.  Find a way of tracking calories that works for you. Get  creative. Try different apps or programs if writing down calories does not work for you. What are some portion control tips?  Know how many calories are in a serving. This will help you know how many servings of a certain food you can have.  Use a measuring cup to measure serving sizes. You could also try weighing out portions on a kitchen scale. With time, you will be able to estimate serving sizes for some foods.  Take some time to put servings of different foods on your favorite plates, bowls, and cups so you know what a serving looks like.  Try not to eat straight from a bag or box. Doing this can lead to overeating. Put the amount you would like to eat in a cup or on a plate to make sure you are eating the right portion.  Use smaller plates, glasses, and bowls to prevent overeating.  Try not to multitask (for example, watch TV or use your computer) while eating. If it is time to eat, sit down at a table and enjoy your food. This will help you to know when you are full. It will also help you to be aware of what you are eating and how much you are eating. What are tips for following this plan? Reading food labels  Check the calorie count compared to the serving size. The serving size may be smaller than what you are used to eating.  Check the source of the calories. Make sure the food you are eating is high in vitamins and protein and low in saturated and trans fats. Shopping  Read nutrition labels while you shop. This will help you make healthy decisions before you decide to purchase your food.  Make a grocery list and stick to it. Cooking  Try to cook your favorite foods in a healthier way. For example, try baking instead of frying.  Use low-fat dairy products. Meal planning  Use more fruits and vegetables. Half of your plate should be fruits and vegetables.  Include lean proteins like poultry and fish. How do I count calories when eating out?  Ask for smaller portion  sizes.  Consider sharing an entree and sides instead of getting your own entree.  If you get your own entree, eat only half. Ask for a box at the beginning of your meal and put the  rest of your entree in it so you are not tempted to eat it.  If calories are listed on the menu, choose the lower calorie options.  Choose dishes that include vegetables, fruits, whole grains, low-fat dairy products, and lean protein.  Choose items that are boiled, broiled, grilled, or steamed. Stay away from items that are buttered, battered, fried, or served with cream sauce. Items labeled "crispy" are usually fried, unless stated otherwise.  Choose water, low-fat milk, unsweetened iced tea, or other drinks without added sugar. If you want an alcoholic beverage, choose a lower calorie option such as a glass of wine or light beer.  Ask for dressings, sauces, and syrups on the side. These are usually high in calories, so you should limit the amount you eat.  If you want a salad, choose a garden salad and ask for grilled meats. Avoid extra toppings like bacon, cheese, or fried items. Ask for the dressing on the side, or ask for olive oil and vinegar or lemon to use as dressing.  Estimate how many servings of a food you are given. For example, a serving of cooked rice is  cup or about the size of half a baseball. Knowing serving sizes will help you be aware of how much food you are eating at restaurants. The list below tells you how big or small some common portion sizes are based on everyday objects: ? 1 oz-4 stacked dice. ? 3 oz-1 deck of cards. ? 1 tsp-1 die. ? 1 Tbsp- a ping-pong ball. ? 2 Tbsp-1 ping-pong ball. ?  cup- baseball. ? 1 cup-1 baseball. Summary  Calorie counting means keeping track of how many calories you eat and drink each day. If you eat fewer calories than your body needs, you should lose weight.  A healthy amount of weight to lose per week is usually 1-2 lb (0.5-0.9 kg). This usually  means reducing your daily calorie intake by 500-750 calories.  The number of calories in a food can be found on a Nutrition Facts label. If a food does not have a Nutrition Facts label, try to look up the calories online or ask your dietitian for help.  Use your calories on foods and drinks that will fill you up, and not on foods and drinks that will leave you hungry.  Use smaller plates, glasses, and bowls to prevent overeating. This information is not intended to replace advice given to you by your health care provider. Make sure you discuss any questions you have with your health care provider. Document Released: 12/23/2004 Document Revised: 11/23/2015 Document Reviewed: 11/23/2015 Elsevier Interactive Patient Education  2017 [employer].

## 2016-06-06 NOTE — Progress Notes (Signed)
Subjective:  Patient ID: Elizabeth Brooks, female    DOB: 1960/03/30  Age: 56 y.o. MRN: [id number]  CC: Follow-up (6 mo fu/weight loss consult. )   HPI Ms. Freeney will like to discuss use of medication to enhance weight loss. She has already made diet change and incorporated daily exercise in that last 64months. She has lost some weight, but has noticed she is not able to loss any further despite maintaining exercise and healthy diet. She has also decreased calorie intake to 1200 per day.  Outpatient Medications Prior to Visit  Medication Sig Dispense Refill  . Cholecalciferol (VITAMIN D) 2000 units CAPS Take 1 capsule by mouth daily.    . famotidine (PEPCID) 40 MG tablet Take 1 tablet (40 mg total) by mouth at bedtime. 30 tablet 6  . linaclotide (LINZESS) 290 MCG CAPS capsule Take 1 capsule (290 mcg total) by mouth daily before breakfast. 30 capsule 3  . Misc Natural Products (TART CHERRY ADVANCED PO) Take 1 capsule by mouth daily.    . Omega-3 Fatty Acids (FISH OIL) 1200 MG CAPS Take 1 capsule by mouth daily.    Marland Kitchen omeprazole (PRILOSEC) 20 MG capsule Take 1 capsule (20 mg total) by mouth 2 (two) times daily before a meal. 180 capsule 3  . TURMERIC PO Take 1 capsule by mouth 2 (two) times daily.    . Diclofenac Sodium (PENNSAID) 2 % SOLN Place 2 application onto the skin 2 (two) times daily. (Patient not taking: Reported on 06/06/2016) 112 g 3   No facility-administered medications prior to visit.     ROS Review of Systems  Constitutional: Negative.   HENT: Negative.   Respiratory: Negative.   Cardiovascular: Negative.   Musculoskeletal: Negative.   Skin: Negative.   Neurological: Negative for dizziness, sensory change, loss of consciousness and headaches.  Psychiatric/Behavioral: Negative for depression and suicidal ideas. The patient is not nervous/anxious and does not have insomnia.     Objective:  BP 120/88   Pulse (!) 55   Temp 98.5 F (36.9 C)   Ht 5\' 4"  (1.626 m)   Wt 193  lb (87.5 kg)   SpO2 97%   BMI 33.13 kg/m   BP Readings from Last 3 Encounters:  06/06/16 120/88  11/16/15 118/84  10/31/15 118/76    Wt Readings from Last 3 Encounters:  06/06/16 193 lb (87.5 kg)  11/19/15 218 lb 14.4 oz (99.3 kg)  11/16/15 219 lb (99.3 kg)    Physical Exam  Constitutional: She is oriented to person, place, and time. No distress.  Neck: Normal range of motion. Neck supple. No thyromegaly present.  Cardiovascular: Normal rate, regular rhythm and normal heart sounds.   No murmur heard. Pulmonary/Chest: Effort normal and breath sounds normal.  Abdominal: Soft. She exhibits no distension.  Musculoskeletal: She exhibits no edema.  Lymphadenopathy:    She has no cervical adenopathy.  Neurological: She is alert and oriented to person, place, and time.  Skin: Skin is warm and dry.  Vitals reviewed.   Lab Results  Component Value Date   GLUCOSE 92 10/05/2015   CHOL 201 (H) 10/05/2015   TRIG 131.0 10/05/2015   HDL 40.20 10/05/2015   LDLCALC 134 (H) 10/05/2015   NA 140 10/05/2015   K 4.1 10/05/2015   CL 106 10/05/2015   CREATININE 0.99 10/05/2015   BUN 19 10/05/2015   CO2 29 10/05/2015   HGBA1C 5.9 10/05/2015    Mm Digital Screening Bilateral  Result Date: 11/12/2015 CLINICAL DATA:  Screening. EXAM: DIGITAL SCREENING BILATERAL MAMMOGRAM WITH CAD COMPARISON:  Previous exam(s). ACR Breast Density Category b: There are scattered areas of fibroglandular density. FINDINGS: There are no findings suspicious for malignancy. Images were processed with CAD. IMPRESSION: No mammographic evidence of malignancy. A result letter of this screening mammogram will be mailed directly to the patient. RECOMMENDATION: Screening mammogram in one year. (Code:SM-B-01Y) BI-RADS CATEGORY  1: Negative. Electronically Signed   By: Altamese Cabal M.D.   On: 11/14/2015 15:25    Assessment & Plan:   I spent a total of 16minutes with Ms. Mihalko. During the office visit, we talked about  the diet changes she has already made and daily exercise regimen she is following. We also talked about use of weight loss medications like phentermine, Qsymia, Contrave and Belviq (cost, mechanism of action and possible side effects).  Jackelin was seen today for follow-up.  Diagnoses and all orders for this visit:  Class 1 obesity without serious comorbidity with body mass index (BMI) of 33.0 to 33.9 in adult, unspecified obesity type -     phentermine (ADIPEX-P) 37.5 MG tablet; Take 1 tablet (37.5 mg total) by mouth daily before breakfast.   I am having Ms. Marcotte start on phentermine. I am also having her maintain her Diclofenac Sodium, Vitamin D, Misc Natural Products (TART CHERRY ADVANCED PO), Fish Oil, TURMERIC PO, omeprazole, famotidine, and linaclotide.  Meds ordered this encounter  Medications  . phentermine (ADIPEX-P) 37.5 MG tablet    Sig: Take 1 tablet (37.5 mg total) by mouth daily before breakfast.    Dispense:  30 tablet    Refill:  0    Order Specific Question:   Supervising Provider    Answer:   Cassandria Anger [[id number]]    Follow-up: Return in about 4 weeks (around 07/04/2016) for weight loss.  Wilfred Lacy, NP

## 2016-06-20 MED FILL — LINZESS 290 MCG CAPSULE: 290 | 30 days supply | Qty: 30 | Fill #2

## 2016-07-04 ENCOUNTER — Ambulatory Visit (INDEPENDENT_AMBULATORY_CARE_PROVIDER_SITE_OTHER): Payer: 59 | Admitting: Nurse Practitioner

## 2016-07-04 ENCOUNTER — Encounter: Payer: Self-pay | Admitting: Nurse Practitioner

## 2016-07-04 DIAGNOSIS — E669 Obesity, unspecified: Secondary | ICD-10-CM

## 2016-07-04 DIAGNOSIS — Z6833 Body mass index (BMI) 33.0-33.9, adult: Secondary | ICD-10-CM | POA: Diagnosis not present

## 2016-07-04 MED ORDER — PHENTERMINE HCL 37.5 MG PO TABS: 37.5000 mg | ORAL_TABLET | Freq: Every day | ORAL | 2 refills | Status: DC

## 2016-07-04 MED FILL — PHENTERMINE 37.5 MG TABLET: 37.5 | 30 days supply | Qty: 30 | Fill #0

## 2016-07-04 NOTE — Progress Notes (Signed)
Subjective:  Patient ID: Raymondo Band, female    DOB: [birth year]  Age: 56 y.o. MRN: [id number]  CC: Follow-up (1 mo fu-)   HPI Mrs. Kennebrew is her for weight loss management with use of phentermine, diet and exercise. She denies any adverse effects with phentermine.  Medication refills needed. Her target weight is approximately 150s Wt Readings from Last 3 Encounters:  07/04/16 185 lb (83.9 kg)  06/06/16 193 lb (87.5 kg)  11/19/15 218 lb 14.4 oz (99.3 kg)   Outpatient Medications Prior to Visit  Medication Sig Dispense Refill  . Cholecalciferol (VITAMIN D) 2000 units CAPS Take 1 capsule by mouth daily.    . Diclofenac Sodium (PENNSAID) 2 % SOLN Place 2 application onto the skin 2 (two) times daily. 112 g 3  . famotidine (PEPCID) 40 MG tablet Take 1 tablet (40 mg total) by mouth at bedtime. 30 tablet 6  . linaclotide (LINZESS) 290 MCG CAPS capsule Take 1 capsule (290 mcg total) by mouth daily before breakfast. 30 capsule 3  . Misc Natural Products (TART CHERRY ADVANCED PO) Take 1 capsule by mouth daily.    . Omega-3 Fatty Acids (FISH OIL) 1200 MG CAPS Take 1 capsule by mouth daily.    Marland Kitchen omeprazole (PRILOSEC) 20 MG capsule Take 1 capsule (20 mg total) by mouth 2 (two) times daily before a meal. 180 capsule 3  . TURMERIC PO Take 1 capsule by mouth 2 (two) times daily.    . phentermine (ADIPEX-P) 37.5 MG tablet Take 1 tablet (37.5 mg total) by mouth daily before breakfast. 30 tablet 0   No facility-administered medications prior to visit.     ROS Review of Systems  Constitutional: Negative for malaise/fatigue.  Respiratory: Negative for shortness of breath.   Cardiovascular: Negative for chest pain, palpitations and leg swelling.  Gastrointestinal: Negative for abdominal pain, constipation and diarrhea.  Neurological: Negative for dizziness, sensory change, weakness and headaches.  Psychiatric/Behavioral: Negative for depression and substance abuse. The patient is not  nervous/anxious and does not have insomnia.     Objective:  BP 108/82   Pulse 70   Temp 98.1 F (36.7 C)   Ht 5\' 4"  (1.626 m)   Wt 185 lb (83.9 kg)   SpO2 98%   BMI 31.76 kg/m   BP Readings from Last 3 Encounters:  07/04/16 108/82  06/06/16 120/88  11/16/15 118/84    Wt Readings from Last 3 Encounters:  07/04/16 185 lb (83.9 kg)  06/06/16 193 lb (87.5 kg)  11/19/15 218 lb 14.4 oz (99.3 kg)    Physical Exam  Constitutional: She is oriented to person, place, and time. No distress.  Neck: No thyromegaly present.  Cardiovascular: Normal rate, regular rhythm and normal heart sounds.   No murmur heard. Pulmonary/Chest: Effort normal and breath sounds normal.  Abdominal: She exhibits no distension.  Musculoskeletal: Normal range of motion. She exhibits no edema or tenderness.  Lymphadenopathy:    She has no cervical adenopathy.  Neurological: She is alert and oriented to person, place, and time.  Skin: Skin is warm and dry.  Psychiatric: She has a normal mood and affect. Her behavior is normal.  Vitals reviewed.   Lab Results  Component Value Date   GLUCOSE 92 10/05/2015   CHOL 201 (H) 10/05/2015   TRIG 131.0 10/05/2015   HDL 40.20 10/05/2015   LDLCALC 134 (H) 10/05/2015   NA 140 10/05/2015   K 4.1 10/05/2015   CL 106 10/05/2015   CREATININE 0.99 10/05/2015  BUN 19 10/05/2015   CO2 29 10/05/2015   HGBA1C 5.9 10/05/2015    Mm Digital Screening Bilateral  Result Date: 11/12/2015 CLINICAL DATA:  Screening. EXAM: DIGITAL SCREENING BILATERAL MAMMOGRAM WITH CAD COMPARISON:  Previous exam(s). ACR Breast Density Category b: There are scattered areas of fibroglandular density. FINDINGS: There are no findings suspicious for malignancy. Images were processed with CAD. IMPRESSION: No mammographic evidence of malignancy. A result letter of this screening mammogram will be mailed directly to the patient. RECOMMENDATION: Screening mammogram in one year. (Code:SM-B-01Y) BI-RADS  CATEGORY  1: Negative. Electronically Signed   By: Altamese Cabal M.D.   On: 11/14/2015 15:25    Assessment & Plan:   Zuzu was seen today for follow-up.  Diagnoses and all orders for this visit:  Class 1 obesity without serious comorbidity with body mass index (BMI) of 33.0 to 33.9 in adult, unspecified obesity type -     phentermine (ADIPEX-P) 37.5 MG tablet; Take 1 tablet (37.5 mg total) by mouth daily before breakfast.   I am having Ms. Vallandingham maintain her Diclofenac Sodium, Vitamin D, Misc Natural Products (TART CHERRY ADVANCED PO), Fish Oil, TURMERIC PO, omeprazole, famotidine, linaclotide, and phentermine.  Meds ordered this encounter  Medications  . phentermine (ADIPEX-P) 37.5 MG tablet    Sig: Take 1 tablet (37.5 mg total) by mouth daily before breakfast.    Dispense:  30 tablet    Refill:  2    Order Specific Question:   Supervising Provider    Answer:   Cassandria Anger [[id number]]   Follow-up: Return in about 3 months (around 10/04/2016) for CPE (fasting).  Wilfred Lacy, NP

## 2016-07-04 NOTE — Patient Instructions (Signed)
Keep up the good work.  Continue current medications

## 2016-07-23 MED FILL — LINZESS 290 MCG CAPSULE: 290 | 30 days supply | Qty: 30 | Fill #3

## 2016-08-01 MED FILL — PHENTERMINE 37.5 MG TABLET: 37.5 | 30 days supply | Qty: 30 | Fill #1

## 2016-08-28 ENCOUNTER — Ambulatory Visit: Payer: 59 | Admitting: Obstetrics and Gynecology

## 2016-08-29 ENCOUNTER — Encounter: Payer: Self-pay | Admitting: Nurse Practitioner

## 2016-09-01 ENCOUNTER — Other Ambulatory Visit: Payer: Self-pay | Admitting: Gastroenterology

## 2016-09-01 ENCOUNTER — Encounter: Payer: Self-pay | Admitting: Gastroenterology

## 2016-09-01 MED FILL — PHENTERMINE 37.5 MG TABLET: 37.5 | 30 days supply | Qty: 30 | Fill #2

## 2016-09-02 MED FILL — LINZESS 290 MCG CAPSULE: 290 | 30 days supply | Qty: 30 | Fill #0

## 2016-09-03 ENCOUNTER — Telehealth: Payer: Self-pay | Admitting: Gastroenterology

## 2016-09-03 NOTE — Telephone Encounter (Signed)
Error

## 2016-09-05 ENCOUNTER — Encounter: Payer: Self-pay | Admitting: Physician Assistant

## 2016-09-05 ENCOUNTER — Ambulatory Visit (INDEPENDENT_AMBULATORY_CARE_PROVIDER_SITE_OTHER): Payer: 59 | Admitting: Physician Assistant

## 2016-09-05 VITALS — BP 124/82 | HR 72 | Ht 64.0 in | Wt 178.0 lb

## 2016-09-05 DIAGNOSIS — K581 Irritable bowel syndrome with constipation: Secondary | ICD-10-CM | POA: Diagnosis not present

## 2016-09-05 DIAGNOSIS — K219 Gastro-esophageal reflux disease without esophagitis: Secondary | ICD-10-CM | POA: Diagnosis not present

## 2016-09-05 MED ORDER — LINACLOTIDE 290 MCG PO CAPS: 290.0000 ug | ORAL_CAPSULE | Freq: Every day | ORAL | 11 refills | Status: DC

## 2016-09-05 NOTE — Patient Instructions (Signed)
We have sent the following medications to your pharmacy for you to pick up at your convenience: [place] 1. Linzess 290 mcg  Take Miralax 17 grams in 8 oz of ter every other day as needed for constipation.

## 2016-09-05 NOTE — Progress Notes (Signed)
Reviewed and agree with documentation and assessment and plan. K. Veena Nandigam , MD   

## 2016-09-05 NOTE — Progress Notes (Signed)
Subjective:    Patient ID: Elizabeth Brooks, female    DOB: [birth year], 56 y.o.   MRN: [id number]  HPI Danahi is a pleasant 56 year old female known to Dr. Silverio Decamp with history of GERD, IBS/chronic constipation, osteoarthritis and anxiety. She was last seen in October 2017. At that time she was started on  Linzess 72 g daily. She found that ineffective for her constipation and dose was increased to 290 g per day which she says have been working well for several months but over the past couple of months she will have periods of time where she will go up to 4 days without a bowel movement. She says when she does have a bowel movement stools are generally loose, she's not noticed any bleeding. She has no current complaints of abdominal pain.  Her reflux symptoms are managed well with omeprazole. Last colonoscopy July 2014 she was noted to have one 3 mm polyp in the ascending colon which was removed. Path consistent with lymphoid aggregate no adenomatous change and also had small internal hemorrhoids.  Review of Systems Pertinent positive and negative review of systems were noted in the above HPI section.  All other review of systems was otherwise negative.  Outpatient Encounter Prescriptions as of 09/05/2016  Medication Sig  . Cholecalciferol (VITAMIN D) 2000 units CAPS Take 1 capsule by mouth daily.  . Diclofenac Sodium (PENNSAID) 2 % SOLN Place 2 application onto the skin 2 (two) times daily.  Marland Kitchen linaclotide (LINZESS) 290 MCG CAPS capsule Take 1 capsule (290 mcg total) by mouth daily before breakfast.  . Misc Natural Products (TART CHERRY ADVANCED PO) Take 1 capsule by mouth daily.  . Omega-3 Fatty Acids (FISH OIL) 1200 MG CAPS Take 1 capsule by mouth daily.  Marland Kitchen omeprazole (PRILOSEC) 20 MG capsule Take 1 capsule (20 mg total) by mouth 2 (two) times daily before a meal.  . phentermine (ADIPEX-P) 37.5 MG tablet Take 1 tablet (37.5 mg total) by mouth daily before breakfast.  . TURMERIC PO Take 1  capsule by mouth 2 (two) times daily.  . [DISCONTINUED] famotidine (PEPCID) 40 MG tablet Take 1 tablet (40 mg total) by mouth at bedtime.  . [DISCONTINUED] LINZESS 290 MCG CAPS capsule TAKE 1 CAPSULE BY MOUTH DAILY BEFORE BREAKFAST.   No facility-administered encounter medications on file as of 09/05/2016.    Allergies  Allergen Reactions  . Penicillins Anaphylaxis and Rash   Patient Active Problem List   Diagnosis Date Noted  . Fibroid   . Patellofemoral syndrome of both knees 10/19/2015  . Pes planus 10/19/2015  . Generalized anxiety disorder 08/23/2015  . Environmental allergies 08/23/2015  . Irritable bowel syndrome with constipation 12/29/2014  . Osteoarthritis 12/28/2014  . GERD (gastroesophageal reflux disease) 02/06/2011   Social History   Social History  . Marital status: Married    Spouse name: N/A  . Number of children: 2  . Years of education: N/A   Occupational History  . 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 Smoking status: Former Smoker    Packs/day: 1.00    Years: 26.00    Types: Cigarettes  . Smokeless tobacco: Never Used  . Alcohol use No  . Drug use: No  . Sexual activity: Yes    Partners: Male    Birth control/ protection: None   Other Topics Concern  . Not on file   Social History Narrative  . No narrative on file    Ms. Ringgenberg's family  history includes Heart disease in her maternal grandmother; Hyperlipidemia in her mother; Hypertension in her mother; Stroke in her sister.      Objective:    Vitals:   09/05/16 0934  BP: 124/82  Pulse: 72    Physical Exam  well-developed African-American female in no acute distress, pleasant blood pressure 124/82 pulse 72 height 5 foot 4, weight 178, BMI 30.5. HEENT ;nontraumatic, cephalic EOMI PERRLA sclera anicteric, Cardiovascular; regular rate and rhythm with S1-S2 no murmur or gallop, Pulmonary; clear bilaterally, Abdomen ;soft, nontender nondistended bowel sounds are active there  is no palpable mass or hepatosplenomegaly, Rectal exam not done, Ext; no clubbing cyanosis or edema skin warm and dry, Neuropsych; mood and affect appropriate       Assessment & Plan:   #69 56 year old female with IBS/C. She has had fairly good result with Linzess 290 g daily. Over the past few months at times going for 3 or 4 days without a bowel movement. She did start taking phentermine a couple of months ago for weight loss and this may be contributing to increasing constipation. #2 internal hemorrhoids #3 GERD, controlled on omeprazole 20 mg once or twice daily #4 colon cancer surveillance-up to date with last colonoscopy July 2014.  Plan; Continue Linzess 290 g daily, multiple refills sent Add MiraLAX 17 g in 8 ounces of water on a when necessary basis Continue high-fiber diet and 16 glasses of water per day. Patient will follow-up with Dr. Silverio Decamp or myself on an as-needed basis.    Amy S Esterwood PA-C 09/05/2016   Cc: Flossie Buffy, NP

## 2016-09-29 ENCOUNTER — Ambulatory Visit: Payer: 59 | Admitting: Obstetrics and Gynecology

## 2016-10-02 ENCOUNTER — Ambulatory Visit (INDEPENDENT_AMBULATORY_CARE_PROVIDER_SITE_OTHER): Payer: 59 | Admitting: Obstetrics and Gynecology

## 2016-10-02 ENCOUNTER — Encounter: Payer: Self-pay | Admitting: Obstetrics and Gynecology

## 2016-10-02 VITALS — BP 120/70 | HR 76 | Resp 14 | Ht 64.0 in | Wt 179.0 lb

## 2016-10-02 DIAGNOSIS — Z01419 Encounter for gynecological examination (general) (routine) without abnormal findings: Secondary | ICD-10-CM

## 2016-10-02 NOTE — Patient Instructions (Signed)

## 2016-10-02 NOTE — Progress Notes (Signed)
56 y.o. [id number] MarriedAfrican AmericanF here for annual exam.  No vaginal bleeding. No vasomotor symptoms. Some vaginal dryness, helped with lubricant.  She has lost about 50 lbs since January (on purpose).     Patient's last menstrual period was 10/07/2014 (approximate).          Sexually active: Yes.    The current method of family planning is post menopausal status.    Exercising: Yes.    walking/ run/ elliptical/ cycling  Smoker:  Former smoker  Health Maintenance: Pap:  10-19-14 WNL  History of abnormal Pap:  no MMG:  11-12-15 WNL Colonoscopy:  07-15-12 polyps, f/u in 5 years.  BMD:   Never TDaP:  2013 Gardasil: N/A   reports that she has quit smoking. Her smoking use included Cigarettes. She has a 26.00 pack-year smoking history. She has never used smokeless tobacco. She reports that she does not drink alcohol or use drugs. She works for [employer], in [place]. She moved here from [place]. 2 grown kids, one in [place], one in [place]. 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 Both with degrees in music.  Former Licensed conveyancer.  Husband is a MD in [employer]  Past Medical History:  Diagnosis Date  . Fibroid   . Gastroesophageal reflux   . Hyperlipidemia   . Hypertension   . IBS (irritable bowel syndrome)   . Osteoarthritis    both knees  . Prediabetes     Past Surgical History:  Procedure Laterality Date  . CESAREAN SECTION     x 2  . EYE SURGERY Right   . KNEE ARTHROSCOPY Right   . KNEE SURGERY Right    removal of large cyst-non-cancerous  . TONSILLECTOMY    . UMBILICAL HERNIA REPAIR    . WISDOM TOOTH EXTRACTION      Current Outpatient Prescriptions  Medication Sig Dispense Refill  . Cholecalciferol (VITAMIN D) 2000 units CAPS Take 1 capsule by mouth daily.    Marland Kitchen linaclotide (LINZESS) 290 MCG CAPS capsule Take 1 capsule (290 mcg total) by mouth daily before breakfast. 30 capsule 11  . Misc Natural Products (TART CHERRY ADVANCED PO) Take 1 capsule by mouth daily.    . Omega-3 Fatty Acids (FISH  OIL) 1200 MG CAPS Take 1 capsule by mouth daily.    Marland Kitchen omeprazole (PRILOSEC) 20 MG capsule Take 1 capsule (20 mg total) by mouth 2 (two) times daily before a meal. 180 capsule 3  . phentermine (ADIPEX-P) 37.5 MG tablet Take 1 tablet (37.5 mg total) by mouth daily before breakfast. 30 tablet 2  . TURMERIC PO Take 1 capsule by mouth 2 (two) times daily.     No current facility-administered medications for this visit.     Family History  Problem Relation Age of Onset  . Hyperlipidemia Mother   . Hypertension Mother   . Stroke Sister   . Heart disease Maternal Grandmother   . Colon cancer Neg Hx     Review of Systems  Constitutional: Negative.   HENT: Negative.   Eyes: Negative.   Respiratory: Negative.   Cardiovascular: Negative.   Gastrointestinal: Negative.   Endocrine: Negative.   Genitourinary: Negative.   Musculoskeletal: Negative.   Skin: Negative.   Allergic/Immunologic: Negative.   Neurological: Negative.   Psychiatric/Behavioral: Negative.   She has some constipation, on lizness, uses miralax prn.   Exam:   BP 120/70 (BP Location: Right Arm, Patient Position: Sitting, Cuff Size: Normal)   Pulse 76   Resp 14   Ht 5\' 4"  (1.626  m)   Wt 179 lb (81.2 kg)   LMP 10/07/2014 (Approximate)   BMI 30.73 kg/m   Weight change: @WEIGHTCHANGE @ Height:   Height: 5\' 4"  (162.6 cm)  Ht Readings from Last 3 Encounters:  10/02/16 5\' 4"  (1.626 m)  09/05/16 5\' 4"  (1.626 m)  07/04/16 5\' 4"  (1.626 m)    General appearance: alert, cooperative and appears stated age Head: Normocephalic, without obvious abnormality, atraumatic Neck: no adenopathy, supple, symmetrical, trachea midline and thyroid normal to inspection and palpation Lungs: clear to auscultation bilaterally Cardiovascular: regular rate and rhythm Breasts: normal appearance, no masses or tenderness Abdomen: soft, non-tender; non distended,  no masses,  no organomegaly Extremities: extremities normal, atraumatic, no cyanosis  or edema Skin: Skin color, texture, turgor normal. No rashes or lesions Lymph nodes: Cervical, supraclavicular, and axillary nodes normal. No abnormal inguinal nodes palpated Neurologic: Grossly normal   Pelvic: External genitalia:  no lesions              Urethra:  normal appearing urethra with no masses, tenderness or lesions              Bartholins and Skenes: normal                 Vagina: normal appearing vagina with normal color and discharge, no lesions              Cervix: no lesions               Bimanual Exam:  Uterus:  normal size, contour, position, consistency, mobility, non-tender              Adnexa: no mass, fullness, tenderness               Rectovaginal: Confirms               Anus:  normal sphincter tone, no lesions  Chaperone was present for exam.  A:  Well Woman with normal exam  P:   Pap next year  Mammogram in 11/18  Colonoscopy next year  Labs with primary  Discussed breast self exam  Discussed calcium and vit D intake

## 2016-10-06 ENCOUNTER — Other Ambulatory Visit: Payer: Self-pay | Admitting: Nurse Practitioner

## 2016-10-06 ENCOUNTER — Telehealth: Payer: Self-pay | Admitting: Nurse Practitioner

## 2016-10-06 ENCOUNTER — Encounter: Payer: 59 | Admitting: Nurse Practitioner

## 2016-10-06 DIAGNOSIS — Z6833 Body mass index (BMI) 33.0-33.9, adult: Principal | ICD-10-CM

## 2016-10-06 DIAGNOSIS — E669 Obesity, unspecified: Secondary | ICD-10-CM

## 2016-10-06 NOTE — Telephone Encounter (Signed)
Patient called in . . . Notified.

## 2016-10-06 NOTE — Telephone Encounter (Signed)
Pt must see her provider for renewal, and since appt is tomorrow can be filled will she see Elizabeth Brooks.../lm,b

## 2016-10-06 NOTE — Telephone Encounter (Signed)
Patient had appt scheduled with Baldo Ash today for CPE and med refills.  Baldo Ash is out of the office.  Patient has rescheduled for tomorrow 10/2 but patient took last phentermine yesterday . She would like to know if another MD or NP could refill this for her?   Please follow up with patient in regard.

## 2016-10-07 ENCOUNTER — Encounter: Payer: 59 | Admitting: Nurse Practitioner

## 2016-10-08 ENCOUNTER — Ambulatory Visit (INDEPENDENT_AMBULATORY_CARE_PROVIDER_SITE_OTHER): Payer: 59 | Admitting: Nurse Practitioner

## 2016-10-08 ENCOUNTER — Other Ambulatory Visit (INDEPENDENT_AMBULATORY_CARE_PROVIDER_SITE_OTHER): Payer: 59

## 2016-10-08 ENCOUNTER — Encounter: Payer: Self-pay | Admitting: Nurse Practitioner

## 2016-10-08 VITALS — BP 122/82 | HR 62 | Temp 98.6°F | Ht 64.0 in | Wt 177.0 lb

## 2016-10-08 DIAGNOSIS — Z136 Encounter for screening for cardiovascular disorders: Secondary | ICD-10-CM

## 2016-10-08 DIAGNOSIS — R739 Hyperglycemia, unspecified: Secondary | ICD-10-CM | POA: Diagnosis not present

## 2016-10-08 DIAGNOSIS — Z13220 Encounter for screening for lipoid disorders: Secondary | ICD-10-CM

## 2016-10-08 DIAGNOSIS — M79652 Pain in left thigh: Secondary | ICD-10-CM

## 2016-10-08 DIAGNOSIS — Z0001 Encounter for general adult medical examination with abnormal findings: Secondary | ICD-10-CM | POA: Diagnosis not present

## 2016-10-08 DIAGNOSIS — E669 Obesity, unspecified: Secondary | ICD-10-CM

## 2016-10-08 DIAGNOSIS — Z6833 Body mass index (BMI) 33.0-33.9, adult: Secondary | ICD-10-CM | POA: Diagnosis not present

## 2016-10-08 DIAGNOSIS — H6123 Impacted cerumen, bilateral: Secondary | ICD-10-CM

## 2016-10-08 LAB — COMPREHENSIVE METABOLIC PANEL
ALK PHOS: 63 U/L (ref 39–117)
ALT: 11 U/L (ref 0–35)
AST: 14 U/L (ref 0–37)
Albumin: 4.1 g/dL (ref 3.5–5.2)
BUN: 21 mg/dL (ref 6–23)
CHLORIDE: 104 meq/L (ref 96–112)
CO2: 28 mEq/L (ref 19–32)
Calcium: 9.3 mg/dL (ref 8.4–10.5)
Creatinine, Ser: 1.09 mg/dL (ref 0.40–1.20)
GFR: 55.46 mL/min — AB (ref >60.00)
GLUCOSE: 99 mg/dL (ref 70–99)
POTASSIUM: 4.7 meq/L (ref 3.5–5.1)
Sodium: 139 mEq/L (ref 135–145)
TOTAL PROTEIN: 7.1 g/dL (ref 6.0–8.3)
Total Bilirubin: 0.5 mg/dL (ref 0.2–1.2)

## 2016-10-08 LAB — HEMOGLOBIN A1C: HEMOGLOBIN A1C: 6 % (ref 4.6–6.5)

## 2016-10-08 LAB — CBC
HCT: 40.5 % (ref 36.0–46.0)
Hemoglobin: 13.1 g/dL (ref 12.0–15.0)
MCHC: 32.3 g/dL (ref 30.0–36.0)
MCV: 86.1 fl (ref 78.0–100.0)
PLATELETS: 311 10*3/uL (ref 150.0–400.0)
RBC: 4.71 Mil/uL (ref 3.87–5.11)
RDW: 13.9 % (ref 11.5–15.5)
WBC: 7.7 10*3/uL (ref 4.0–10.5)

## 2016-10-08 LAB — TSH: TSH: 1.71 u[IU]/mL (ref 0.35–4.50)

## 2016-10-08 LAB — LIPID PANEL
CHOL/HDL RATIO: 5
Cholesterol: 212 mg/dL — ABNORMAL HIGH (ref 0–200)
HDL: 45.5 mg/dL (ref >39.00)
LDL CALC: 143 mg/dL — AB (ref 0–99)
NonHDL: 166.21
TRIGLYCERIDES: 116 mg/dL (ref 0.0–149.0)
VLDL: 23.2 mg/dL (ref 0.0–40.0)

## 2016-10-08 MED ORDER — PHENTERMINE HCL 37.5 MG PO TABS: 37.5000 mg | ORAL_TABLET | Freq: Every day | ORAL | 2 refills | Status: DC

## 2016-10-08 MED FILL — PHENTERMINE 37.5 MG TABLET: 37.5 | 30 days supply | Qty: 30 | Fill #0

## 2016-10-08 NOTE — Progress Notes (Signed)
Subjective:    Patient ID: Elizabeth Brooks, female    DOB: 10-21-1960, 56 y.o.   MRN: [id number]  Patient presents today for complete physical  Leg Pain   Incident onset: onset 1year ago. There was no injury mechanism. The pain is present in the left thigh (left lateral thigh pain). The quality of the pain is described as aching (increased sensitivity, tender to touch). The pain is moderate. The pain has been intermittent since onset. Pertinent negatives include no inability to bear weight, loss of motion, loss of sensation, muscle weakness, numbness or tingling. She reports no foreign bodies present. The symptoms are aggravated by palpation. She has tried nothing for the symptoms.  Ear Fullness   There is pain in both ears. This is a new problem. The current episode started 1 to 4 weeks ago. The problem has been unchanged. There has been no fever. Associated symptoms include hearing loss. Pertinent negatives include no abdominal pain, coughing, diarrhea, ear discharge, headaches, neck pain, rash, rhinorrhea, sore throat or vomiting. She has tried nothing for the symptoms.   Weight loss: Denies any advaerse effects with phentermine in last 39months. Continues with healthy diet, portion control and regular exercise. Target weight 150Lbs. Start weight 193Lbs 06/06/2016 Wt Readings from Last 3 Encounters:  10/08/16 177 lb (80.3 kg)  10/02/16 179 lb (81.2 kg)  09/05/16 178 lb (80.7 kg)   Immunizations: (TDAP, Hep C screen, Pneumovax, Influenza, zoster)  Health Maintenance  Topic Date Due  . Flu Shot  04/06/2017*  .  Hepatitis C: One time screening is recommended by Center for Disease Control  (CDC) for  adults born from [birth year] through 1963.   10/03/2017*  . HIV Screening  10/03/2017*  . Pap Smear  10/18/2017  . Mammogram  11/11/2017  . Tetanus Vaccine  01/14/2021  . Colon Cancer Screening  07/16/2022  *Topic was postponed. The date shown is not the original due date.   Diet:heart  healthy.  Weight:  Wt Readings from Last 3 Encounters:  10/08/16 177 lb (80.3 kg)  10/02/16 179 lb (81.2 kg)  09/05/16 178 lb (80.7 kg)   Exercise:walking, running daily.  Fall Risk: Fall Risk  10/08/2016  Falls in the past year? No   Depression/Suicide: Depression screen PHQ 2/9 10/08/2016  Decreased Interest 0  Down, Depressed, Hopeless 0  PHQ - 2 Score 0   Vision:up to date, done annually.  Dental:up to date, done every 3months.  Medications and allergies reviewed with patient and updated if appropriate.  Patient Active Problem List   Diagnosis Date Noted  . Fibroid   . Patellofemoral syndrome of both knees 10/19/2015  . Pes planus 10/19/2015  . Generalized anxiety disorder 08/23/2015  . Environmental allergies 08/23/2015  . Irritable bowel syndrome with constipation 12/29/2014  . Osteoarthritis 12/28/2014  . GERD (gastroesophageal reflux disease) 02/06/2011    Current Outpatient Prescriptions on File Prior to Visit  Medication Sig Dispense Refill  . Cholecalciferol (VITAMIN D) 2000 units CAPS Take 1 capsule by mouth daily.    Marland Kitchen linaclotide (LINZESS) 290 MCG CAPS capsule Take 1 capsule (290 mcg total) by mouth daily before breakfast. 30 capsule 11  . Omega-3 Fatty Acids (FISH OIL) 1200 MG CAPS Take 1 capsule by mouth daily.    Marland Kitchen omeprazole (PRILOSEC) 20 MG capsule Take 1 capsule (20 mg total) by mouth 2 (two) times daily before a meal. 180 capsule 3  . TURMERIC PO Take 1 capsule by mouth 2 (two) times daily.    Marland Kitchen  Misc Natural Products (TART CHERRY ADVANCED PO) Take 1 capsule by mouth daily.     No current facility-administered medications on file prior to visit.     Past Medical History:  Diagnosis Date  . Fibroid   . Gastroesophageal reflux   . Hyperlipidemia   . Hypertension   . IBS (irritable bowel syndrome)   . Osteoarthritis    both knees  . Prediabetes     Past Surgical History:  Procedure Laterality Date  . CESAREAN SECTION     x 2  . EYE  SURGERY Right   . KNEE ARTHROSCOPY Right   . KNEE SURGERY Right    removal of large cyst-non-cancerous  . TONSILLECTOMY    . UMBILICAL HERNIA REPAIR    . WISDOM TOOTH EXTRACTION      Social History   Social History  . Marital status: Married    Spouse name: N/A  . Number of children: 2  . Years of education: N/A   Occupational History  . 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 Smoking status: Former Smoker    Packs/day: 1.00    Years: 26.00    Types: Cigarettes  . Smokeless tobacco: Never Used  . Alcohol use No  . Drug use: No  . Sexual activity: Yes    Partners: Male    Birth control/ protection: None, Post-menopausal   Other Topics Concern  . None   Social History Narrative  . None    Family History  Problem Relation Age of Onset  . Hyperlipidemia Mother   . Hypertension Mother   . Stroke Sister   . Heart disease Maternal Grandmother   . Colon cancer Neg Hx         Review of Systems  Constitutional: Negative for fever, malaise/fatigue and weight loss.  HENT: Positive for hearing loss. Negative for congestion, ear discharge, rhinorrhea and sore throat.   Eyes:       Negative for visual changes  Respiratory: Negative for cough and shortness of breath.   Cardiovascular: Negative for chest pain, palpitations and leg swelling.  Gastrointestinal: Negative for abdominal pain, blood in stool, constipation, diarrhea, heartburn and vomiting.  Genitourinary: Negative for dysuria, frequency and urgency.  Musculoskeletal: Positive for myalgias. Negative for falls, joint pain and neck pain.       Left lateral thigh pain  Skin: Negative for itching and rash.  Neurological: Negative for dizziness, tingling, sensory change, focal weakness, loss of consciousness, weakness, numbness and headaches.  Endo/Heme/Allergies: Does not bruise/bleed easily.  Psychiatric/Behavioral: Negative for depression, substance abuse and suicidal ideas. The patient is not  nervous/anxious.     Objective:   Vitals:   10/08/16 0830  BP: 122/82  Pulse: 62  Temp: 98.6 F (37 C)  SpO2: 97%    Body mass index is 30.38 kg/m.   Physical Examination:  Physical Exam  Constitutional: She is oriented to person, place, and time and well-developed, well-nourished, and in no distress. No distress.  HENT:  Right Ear: Tympanic membrane, external ear and ear canal normal. No mastoid tenderness. No middle ear effusion.  Left Ear: Tympanic membrane, external ear and ear canal normal. No mastoid tenderness.  No middle ear effusion.  Nose: Nose normal.  Mouth/Throat: Oropharynx is clear and moist. No oropharyngeal exudate.  Eyes: Pupils are equal, round, and reactive to light. Conjunctivae and EOM are normal. No scleral icterus.  Neck: Normal range of motion. Neck supple. No thyromegaly present.  Cardiovascular: Normal rate, normal heart sounds and intact distal pulses.   Pulmonary/Chest: Effort normal and breath sounds normal. She exhibits no tenderness.  Abdominal: Soft. Bowel sounds are normal. She exhibits no distension. There is no tenderness.  Genitourinary:  Genitourinary Comments: Breast and pelvic exam deferred to GYN  Musculoskeletal: Normal range of motion. She exhibits tenderness. She exhibits no edema or deformity.       Left hip: Normal.       Left knee: Normal.       Legs: Lymphadenopathy:    She has no cervical adenopathy.  Neurological: She is alert and oriented to person, place, and time. Gait normal.  Skin: Skin is warm and dry.  Psychiatric: Affect and judgment normal.    Procedure Note :     Procedure :  Ear irrigation (bilateral)   Indication:  Cerumen impaction (bilateral)   Risks, including pain, dizziness, eardrum perforation, bleeding, infection and others as well as benefits were explained to the patient in detail. Verbal consent was obtained and the patient agreed to proceed.    We used "The Elephant Ear Irrigation Device"  filled with lukewarm water for irrigation. A large amount wax was recovered. Procedure has also required manual wax removal with an ear loop.   Tolerated well. Complications: None.   Postprocedure instructions :  Call if problems.  ASSESSMENT and PLAN:  Elizabeth Brooks was seen today for annual exam.  Diagnoses and all orders for this visit:  Encounter for preventative adult health care exam with abnormal findings -     CBC; Future -     TSH; Future -     Comprehensive metabolic panel; Future  Bilateral hearing loss due to cerumen impaction  Left thigh pain -     [place] LT LOWER EXTREM LTD SOFT TISSUE NON VASCULAR; Future  Encounter for lipid screening for cardiovascular disease -     Lipid panel; Future  Hyperglycemia -     Hemoglobin A1c; Future  Class 1 obesity without serious comorbidity with body mass index (BMI) of 33.0 to 33.9 in adult, unspecified obesity type -     phentermine (ADIPEX-P) 37.5 MG tablet; Take 1 tablet (37.5 mg total) by mouth daily before breakfast.   No problem-specific Assessment & Plan notes found for this encounter.     Follow up: Return in about 6 months (around 04/08/2017) for hyperlipidemia and weight loss.  Wilfred Lacy, NP

## 2016-10-08 NOTE — Patient Instructions (Addendum)
Persistent elevation in LDL and total cholesterol. HgbA1c indicates prediabetes. Normal cbc, TSH and cmp. Encourage low carb and low fat diet. Follow up in 48months

## 2016-10-10 MED FILL — LINZESS 290 MCG CAPSULE: 290 | 30 days supply | Qty: 30 | Fill #1

## 2016-10-28 ENCOUNTER — Other Ambulatory Visit: Payer: Self-pay | Admitting: Obstetrics and Gynecology

## 2016-10-28 ENCOUNTER — Other Ambulatory Visit: Payer: 59

## 2016-10-28 DIAGNOSIS — Z0000 Encounter for general adult medical examination without abnormal findings: Secondary | ICD-10-CM

## 2016-11-04 MED FILL — PHENTERMINE 37.5 MG TABLET: 37.5 | 30 days supply | Qty: 30 | Fill #1

## 2016-11-07 ENCOUNTER — Ambulatory Visit (INDEPENDENT_AMBULATORY_CARE_PROVIDER_SITE_OTHER): Payer: 59 | Admitting: Podiatry

## 2016-11-07 DIAGNOSIS — B351 Tinea unguium: Secondary | ICD-10-CM

## 2016-11-07 DIAGNOSIS — L600 Ingrowing nail: Secondary | ICD-10-CM

## 2016-11-07 NOTE — Progress Notes (Signed)
   Subjective:    Patient ID: Elizabeth Brooks, female    DOB: 01-Mar-1960, 56 y.o.   MRN: [id number]  HPI    Review of Systems  All other systems reviewed and are negative.      Objective:   Physical Exam        Assessment & Plan:

## 2016-11-10 ENCOUNTER — Encounter: Payer: Self-pay | Admitting: Nurse Practitioner

## 2016-11-11 NOTE — Progress Notes (Signed)
Subjective:    Patient ID: Elizabeth Brooks, female   DOB: 56 y.o.   MRN: [id number]   HPI patient presents concerned about pain in her right second digit with thickness of the nailbed and is concerned about fungus also. States she's very active and does not smoke    Review of Systems  All other systems reviewed and are negative.       Objective:  Physical Exam  Constitutional: She appears well-developed and well-nourished.  Cardiovascular: Intact distal pulses.  Neurological: She is alert.  Skin: Skin is warm.  Nursing note and vitals reviewed.  neurovascular status intact muscle strength adequate range of motion within normal limits with patient noted to have a thickened right second nail with dystrophic changes and mild looseness of the nailbed. There is also moderate discoloration associated with     Assessment:    Nail disease with probable trauma as the precipitating factor with possible secondary fungal infection     Plan:   H&P conditions reviewed and I debrided the nail applied padding and discussed at one point in the future permanent nail procedure may be necessary for this

## 2016-11-13 ENCOUNTER — Ambulatory Visit: Payer: 59 | Admitting: Nurse Practitioner

## 2016-11-14 ENCOUNTER — Encounter: Payer: Self-pay | Admitting: Nurse Practitioner

## 2016-11-14 ENCOUNTER — Ambulatory Visit (INDEPENDENT_AMBULATORY_CARE_PROVIDER_SITE_OTHER): Payer: 59 | Admitting: Nurse Practitioner

## 2016-11-14 VITALS — BP 120/88 | HR 77 | Temp 98.5°F | Ht 64.0 in | Wt 174.0 lb

## 2016-11-14 DIAGNOSIS — Z6833 Body mass index (BMI) 33.0-33.9, adult: Secondary | ICD-10-CM | POA: Diagnosis not present

## 2016-11-14 DIAGNOSIS — E669 Obesity, unspecified: Secondary | ICD-10-CM

## 2016-11-14 MED FILL — LINZESS 290 MCG CAPSULE: 290 | 30 days supply | Qty: 30 | Fill #2

## 2016-11-14 NOTE — Progress Notes (Signed)
Subjective:  Patient ID: Raymondo Band, female    DOB: 1960/07/29  Age: 56 y.o. MRN: [id number]  CC: Medication Problem (med consult--adipex)   HPI Ms. Kleinman presents today to discuss possible change in weight loss medication. She thinks her weight loss is stagnant despite decrease in calorie intake (1200cal per day), high impact exercise daily, and use of phentermine. Her weight loss goal in 150Ibs. She is now at 174Ibs. She has lost a total of 19Ibs since 06/06/2016.  Outpatient Medications Prior to Visit  Medication Sig Dispense Refill  . Cholecalciferol (VITAMIN D) 2000 units CAPS Take 1 capsule by mouth daily.    Marland Kitchen linaclotide (LINZESS) 290 MCG CAPS capsule Take 1 capsule (290 mcg total) by mouth daily before breakfast. 30 capsule 11  . Misc Natural Products (TART CHERRY ADVANCED PO) Take 1 capsule by mouth daily.    . Omega-3 Fatty Acids (FISH OIL) 1200 MG CAPS Take 1 capsule by mouth daily.    Marland Kitchen omeprazole (PRILOSEC) 20 MG capsule Take 1 capsule (20 mg total) by mouth 2 (two) times daily before a meal. 180 capsule 3  . phentermine (ADIPEX-P) 37.5 MG tablet Take 1 tablet (37.5 mg total) by mouth daily before breakfast. 30 tablet 2  . TURMERIC PO Take 1 capsule by mouth 2 (two) times daily.     No facility-administered medications prior to visit.     ROS See HPI  Objective:  BP 120/88   Pulse 77   Temp 98.5 F (36.9 C)   Ht 5\' 4"  (1.626 m)   Wt 174 lb (78.9 kg)   LMP 10/07/2014 (Approximate)   SpO2 98%   BMI 29.87 kg/m   BP Readings from Last 3 Encounters:  11/14/16 120/88  10/08/16 122/82  10/02/16 120/70    Wt Readings from Last 3 Encounters:  11/14/16 174 lb (78.9 kg)  10/08/16 177 lb (80.3 kg)  10/02/16 179 lb (81.2 kg)    Physical Exam  Constitutional: She is oriented to person, place, and time. No distress.  Cardiovascular: Normal rate, regular rhythm and normal heart sounds.  Pulmonary/Chest: Effort normal and breath sounds normal.    Musculoskeletal: She exhibits no edema.  Neurological: She is alert and oriented to person, place, and time.  Skin: Skin is warm and dry.  Vitals reviewed.   Lab Results  Component Value Date   WBC 7.7 10/08/2016   HGB 13.1 10/08/2016   HCT 40.5 10/08/2016   PLT 311.0 10/08/2016   GLUCOSE 99 10/08/2016   CHOL 212 (H) 10/08/2016   TRIG 116.0 10/08/2016   HDL 45.50 10/08/2016   LDLCALC 143 (H) 10/08/2016   ALT 11 10/08/2016   AST 14 10/08/2016   NA 139 10/08/2016   K 4.7 10/08/2016   CL 104 10/08/2016   CREATININE 1.09 10/08/2016   BUN 21 10/08/2016   CO2 28 10/08/2016   TSH 1.71 10/08/2016   HGBA1C 6.0 10/08/2016    Mm Digital Screening Bilateral  Result Date: 11/12/2015 CLINICAL DATA:  Screening. EXAM: DIGITAL SCREENING BILATERAL MAMMOGRAM WITH CAD COMPARISON:  Previous exam(s). ACR Breast Density Category b: There are scattered areas of fibroglandular density. FINDINGS: There are no findings suspicious for malignancy. Images were processed with CAD. IMPRESSION: No mammographic evidence of malignancy. A result letter of this screening mammogram will be mailed directly to the patient. RECOMMENDATION: Screening mammogram in one year. (Code:SM-B-01Y) BI-RADS CATEGORY  1: Negative. Electronically Signed   By: Altamese Cabal M.D.   On: 11/14/2015 15:25  Assessment & Plan:   Monserrath was seen today for medication problem.  Diagnoses and all orders for this visit:  Class 1 obesity without serious comorbidity with body mass index (BMI) of 33.0 to 33.9 in adult, unspecified obesity type   I am having Lessly Rubens maintain her Vitamin D, Misc Natural Products (TART CHERRY ADVANCED PO), Fish Oil, TURMERIC PO, omeprazole, linaclotide, and phentermine.  No orders of the defined types were placed in this encounter.   Follow-up: Return if symptoms worsen or fail to improve.  Wilfred Lacy, NP

## 2016-11-14 NOTE — Patient Instructions (Addendum)
  50% of this visit was focused on counseling about other prescription appetite suppressants, possible side effects and cost. She decided to maintain use of phentermine due to high cost of belviq, contrave and Qsymia.

## 2016-11-16 ENCOUNTER — Encounter: Payer: Self-pay | Admitting: Nurse Practitioner

## 2016-11-17 ENCOUNTER — Ambulatory Visit
Admission: RE | Admit: 2016-11-17 | Discharge: 2016-11-17 | Disposition: A | Discharge status: Home / Self Care | Payer: 59 | Source: Ambulatory Visit | Attending: Obstetrics and Gynecology | Admitting: Obstetrics and Gynecology

## 2016-11-17 DIAGNOSIS — Z1231 Encounter for screening mammogram for malignant neoplasm of breast: Secondary | ICD-10-CM | POA: Diagnosis not present

## 2016-11-17 DIAGNOSIS — Z0000 Encounter for general adult medical examination without abnormal findings: Secondary | ICD-10-CM

## 2016-12-01 ENCOUNTER — Other Ambulatory Visit: Payer: Self-pay | Admitting: Gastroenterology

## 2016-12-01 MED FILL — OMEPRAZOLE 20 MG CAP: 20 | 90 days supply | Qty: 180 | Fill #0

## 2016-12-08 MED FILL — PHENTERMINE 37.5 MG TABLET: 37.5 | 30 days supply | Qty: 30 | Fill #2

## 2017-01-12 ENCOUNTER — Other Ambulatory Visit: Payer: 59

## 2017-01-14 ENCOUNTER — Encounter: Payer: Self-pay | Admitting: Nurse Practitioner

## 2017-02-02 MED FILL — LINZESS 290 MCG CAPSULE: 290 | 90 days supply | Qty: 90 | Fill #0

## 2017-04-10 ENCOUNTER — Ambulatory Visit: Payer: 59 | Admitting: Family Medicine

## 2017-04-10 ENCOUNTER — Encounter: Payer: Self-pay | Admitting: Family Medicine

## 2017-04-10 ENCOUNTER — Ambulatory Visit: Payer: Self-pay

## 2017-04-10 VITALS — BP 136/82 | HR 59 | Ht 64.0 in | Wt 174.0 lb

## 2017-04-10 DIAGNOSIS — M25531 Pain in right wrist: Secondary | ICD-10-CM

## 2017-04-10 DIAGNOSIS — M2142 Flat foot [pes planus] (acquired), left foot: Secondary | ICD-10-CM | POA: Diagnosis not present

## 2017-04-10 DIAGNOSIS — S63091A Other subluxation of right wrist and hand, initial encounter: Secondary | ICD-10-CM | POA: Diagnosis not present

## 2017-04-10 DIAGNOSIS — M2141 Flat foot [pes planus] (acquired), right foot: Secondary | ICD-10-CM

## 2017-04-10 NOTE — Assessment & Plan Note (Signed)
Exacerbation with breakdown of the transverse arch now.  Will have patient come back for custom orthotics with her failing all other conservative therapy.  Will increase patient's metatarsal pad that I think will help narrow the transverse arch and be very beneficial.  Follow-up again 4 weeks

## 2017-04-10 NOTE — Assessment & Plan Note (Signed)
Topical anti-inflammatories given, icing regimen, we discussed bracing and taping.  We discussed that this likely will resolve over the 2 weeks.  Discussed proper lifting mechanics.  Follow-up in 4 weeks if not completely resolved

## 2017-04-10 NOTE — Progress Notes (Signed)
[facility] [place], [place] [address] Phone: [phone] Subjective:     CC: Foot pain and wrist pain  [id number]  Elizabeth Brooks is a 57 y.o. female coming in with complaint of   Right medial Foot pain.  Patient was seen greater than a year ago for this problem.  Had pes planus bilaterally.  Patient at last follow-up though was making progress.  Patient states she usually wears wide shoes. Walking, pressure, and certain shoes make it worse. Sharp pain. Intermitted.   Patient is also complaining right of wrist pain. States it feels as if her wrist dislocates. Makes a "snapping sound". Uses computer daily. Guards her wrist. Picking up heavy things makes it worse.    Past Medical History:  Diagnosis Date  . Fibroid   . Gastroesophageal reflux   . Hyperlipidemia   . Hypertension   . IBS (irritable bowel syndrome)   . Osteoarthritis    both knees  . Prediabetes    Past Surgical History:  Procedure Laterality Date  . CESAREAN SECTION     x 2  . EYE SURGERY Right   . KNEE ARTHROSCOPY Right   . KNEE SURGERY Right    removal of large cyst-non-cancerous  . TONSILLECTOMY    . UMBILICAL HERNIA REPAIR    . WISDOM TOOTH EXTRACTION     Social History   Socioeconomic History  . Marital status: Married    Spouse name: Not on file  . Number of children: 2  . Years of education: Not on file  . Highest education level: Not on file  Occupational History  . 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 Financial resource strain: Not on file  . Food insecurity:    Worry: Not on file    Inability: Not on file  . Transportation needs:    Medical: Not on file    Non-medical: Not on file  Tobacco Use  . Smoking status: Former Smoker    Packs/day: 1.00    Years: 26.00    Pack years: 26.00    Types: Cigarettes  . Smokeless tobacco: Never Used  Substance and Sexual Activity  . Alcohol use: No  . Drug use: No  . Sexual activity:  Yes    Partners: Male    Birth control/protection: None, Post-menopausal  Lifestyle  . Physical activity:    Days per week: Not on file    Minutes per session: Not on file  . Stress: Not on file  Relationships  . Social connections:    Talks on phone: Not on file    Gets together: Not on file    Attends religious service: Not on file    Active member of club or organization: Not on file    Attends meetings of clubs or organizations: Not on file    Relationship status: Not on file  Other Topics Concern  . Not on file  Social History Narrative  . Not on file   Allergies  Allergen Reactions  . Penicillins Anaphylaxis and Rash   Family History  Problem Relation Age of Onset  . Hyperlipidemia Mother   . Hypertension Mother   . Stroke Sister   . Heart disease Maternal Grandmother   . Colon cancer Neg Hx      Past medical history, social, surgical and family history all reviewed in electronic medical record.  No pertanent information unless stated regarding to the chief complaint.   Review  of Systems:Review of systems updated and as accurate as of 04/10/17  No headache, visual changes, nausea, vomiting, diarrhea, constipation, dizziness, abdominal pain, skin rash, fevers, chills, night sweats, weight loss, swollen lymph nodes, body aches, joint swelling, muscle aches, chest pain, shortness of breath, mood changes.   Objective  Blood pressure 136/82, pulse (!) 59, height 5\' 4"  (1.626 m), weight 174 lb (78.9 kg), last menstrual period 10/07/2014, SpO2 98 %. Systems examined below as of 04/10/17   General: No apparent distress alert and oriented x3 mood and affect normal, dressed appropriately.  HEENT: Pupils equal, extraocular movements intact  Respiratory: Patient's speak in full sentences and does not appear short of breath  Cardiovascular: No lower extremity edema, non tender, no erythema  Skin: Warm dry intact with no signs of infection or rash on extremities or on axial  skeleton.  Abdomen: Soft nontender  Neuro: Cranial nerves II through XII are intact, neurovascularly intact in all extremities with 2+ DTRs and 2+ pulses.  Lymph: No lymphadenopathy of posterior or anterior cervical chain or axillae bilaterally.  Gait normal with good balance and coordination.  MSK:  Non tender with full range of motion and good stability and symmetric strength and tone of shoulders, elbows,  hip, knee and ankles bilaterally.  Foot exam shows the patient does have pes planus bilaterally with over pronation.  Significant breakdown the transverse arch.  Spleen between the first and second toes with bunion pending information on the right.  First metatarsal is severely tender to palpation with mild swelling noted.   Right wrist exam shows no significant abnormality.  Patient does have tenderness over the ulnar aspect of the ECU.  No subluxation no palpated.  Patient has no lunate disassociation and has near full range of motion.  Good grip strength.  Negative Tinel's and negative Finkelstein's contralateral wrist unremarkable  MSK US performed of: Right wrist This study was ordered, performed, and interpreted by Charlann Boxer D.O.  Wrist: Patient does have an ECU hypoechoic changes within the tendon sheath.  Mild increase in Doppler flow but no true tear appreciated.  All bones are unremarkable.Marland Kitchen  IMPRESSION: Signs of resolved ECU subluxation     Impression and Recommendations:     This case required medical decision making of moderate complexity.      Note: This dictation was prepared with Dragon dictation along with smaller phrase technology. Any transcriptional errors that result from this process are unintentional.

## 2017-04-10 NOTE — Patient Instructions (Addendum)
Good to see you  Ice 20 minutes 2 times daily. Usually after activity and before bed. We will get you orthotics.  pennsaid pinkie amount topically 2 times daily as needed.   Avoid being bare foot.  Wrap wrist daily for 2 weeks then only when needed You should do great  See me again in 4 weeks

## 2017-04-13 ENCOUNTER — Ambulatory Visit: Payer: 59 | Admitting: Nurse Practitioner

## 2017-04-13 ENCOUNTER — Encounter: Payer: Self-pay | Admitting: Nurse Practitioner

## 2017-04-13 VITALS — BP 122/84 | HR 59 | Temp 98.1°F | Ht 64.0 in | Wt 173.0 lb

## 2017-04-13 DIAGNOSIS — Z713 Dietary counseling and surveillance: Secondary | ICD-10-CM | POA: Diagnosis not present

## 2017-04-13 NOTE — Progress Notes (Signed)
Subjective:  Patient ID: Elizabeth Brooks, female    DOB: [birth year]  Age: 57 y.o. MRN: [id number]  CC: Follow-up (F/U for weight management..Last appt was Nov and weightloss has slowed dwon. )  HPI Ms. Granados is here to discuss continuous use of phentermine. Medication last used in December. Has been of medication 06/2016 to 12/2016. Has lost total of 20Lbs. Her weight loss goal was 150Lbs. She has made changes to her diet (keto diet), Decreased portion sizes and maintain daily high intensity exercise (running, elliptical and weight training). She is also taking Part of [facility] Well program. BMI started at 33.13. BMI today id 29.7  Outpatient Medications Prior to Visit  Medication Sig Dispense Refill  . Cholecalciferol (VITAMIN D) 2000 units CAPS Take 1 capsule by mouth daily.    Marland Kitchen linaclotide (LINZESS) 290 MCG CAPS capsule Take 1 capsule (290 mcg total) by mouth daily before breakfast. 30 capsule 11  . Misc Natural Products (TART CHERRY ADVANCED PO) Take 1 capsule by mouth daily.    . Omega-3 Fatty Acids (FISH OIL) 1200 MG CAPS Take 1 capsule by mouth daily.    Marland Kitchen omeprazole (PRILOSEC) 20 MG capsule TAKE 1 CAPSULE (20 MG TOTAL) BY MOUTH 2 (TWO) TIMES DAILY BEFORE A MEAL. 180 capsule 3  . TURMERIC PO Take 1 capsule by mouth 2 (two) times daily.    Marland Kitchen linaclotide (LINZESS) 145 MCG CAPS capsule Take by mouth.    . phentermine (ADIPEX-P) 37.5 MG tablet Take 1 tablet (37.5 mg total) by mouth daily before breakfast. 30 tablet 2   No facility-administered medications prior to visit.     ROS Review of Systems  Constitutional: Negative for chills, fever and malaise/fatigue.  Respiratory: Negative for shortness of breath.   Cardiovascular: Negative for chest pain, palpitations and leg swelling.  Gastrointestinal: Negative for abdominal pain and constipation.  Musculoskeletal: Negative for joint pain.  Psychiatric/Behavioral: Negative for depression. The patient is not nervous/anxious and does not  have insomnia.      Objective:  BP 122/84 (BP Location: Left Arm, Patient Position: Sitting, Cuff Size: Normal)   Pulse (!) 59   Temp 98.1 F (36.7 C) (Oral)   Ht 5\' 4"  (1.626 m)   Wt 173 lb (78.5 kg)   LMP 10/07/2014 (Approximate)   SpO2 98%   BMI 29.70 kg/m   BP Readings from Last 3 Encounters:  04/13/17 122/84  04/10/17 136/82  11/14/16 120/88    Wt Readings from Last 3 Encounters:  04/13/17 173 lb (78.5 kg)  04/10/17 174 lb (78.9 kg)  11/14/16 174 lb (78.9 kg)    Physical Exam  Constitutional: She is oriented to person, place, and time.  Cardiovascular: Normal rate, regular rhythm and normal heart sounds.  Pulmonary/Chest: Effort normal and breath sounds normal.  Musculoskeletal: She exhibits no edema.  Neurological: She is alert and oriented to person, place, and time.  Psychiatric: She has a normal mood and affect. Her behavior is normal. Thought content normal.  Vitals reviewed.   Lab Results  Component Value Date   WBC 7.7 10/08/2016   HGB 13.1 10/08/2016   HCT 40.5 10/08/2016   PLT 311.0 10/08/2016   GLUCOSE 99 10/08/2016   CHOL 212 (H) 10/08/2016   TRIG 116.0 10/08/2016   HDL 45.50 10/08/2016   LDLCALC 143 (H) 10/08/2016   ALT 11 10/08/2016   AST 14 10/08/2016   NA 139 10/08/2016   K 4.7 10/08/2016   CL 104 10/08/2016   CREATININE 1.09 10/08/2016  BUN 21 10/08/2016   CO2 28 10/08/2016   TSH 1.71 10/08/2016   HGBA1C 6.0 10/08/2016    Mm Screening Breast Tomo Bilateral  Result Date: 11/18/2016 CLINICAL DATA:  Screening. EXAM: 2D DIGITAL SCREENING BILATERAL MAMMOGRAM WITH CAD AND ADJUNCT TOMO COMPARISON:  Previous exam(s). ACR Breast Density Category b: There are scattered areas of fibroglandular density. FINDINGS: There are no findings suspicious for malignancy. Images were processed with CAD. IMPRESSION: No mammographic evidence of malignancy. A result letter of this screening mammogram will be mailed directly to the patient. RECOMMENDATION:  Screening mammogram in one year. (Code:SM-B-01Y) BI-RADS CATEGORY  1: Negative. Electronically Signed   By: Lovey Newcomer M.D.   On: 11/18/2016 08:09    Assessment & Plan:   [name] was seen today for follow-up.  Diagnoses and all orders for this visit:  Encounter for weight loss counseling   I have discontinued Christian Tenny's phentermine. I am also having her maintain her Vitamin D, Misc Natural Products (TART CHERRY ADVANCED PO), Fish Oil, TURMERIC PO, linaclotide, omeprazole, and linaclotide.  No orders of the defined types were placed in this encounter.  Follow-up: Return in about 6 months (around 10/08/2017) for CPE (fasting).  Wilfred Lacy, NP

## 2017-04-13 NOTE — Patient Instructions (Addendum)
Please reach out [facility] or dietician with [facility] for additional help to weight loss.  I will recommend taking 43months break from any weight loss medication use 01/2017 to 06/2017. Belviq, contrave and Qsymia not used in past due to hight cost.

## 2017-04-29 NOTE — Progress Notes (Signed)
Procedure Note   Patient was fitted for a : standard, cushioned, semi-rigid orthotic. The orthotic was heated and afterward the patient patient seated position and molded The patient was positioned in subtalar neutral position and 10 degrees of ankle dorsiflexion in a weight bearing stance. After completion of molding, patient did have orthotic management The blank was ground to a stable position for weight bearing. Size:9.5 Base: Carbon fiber Additional Posting and Padding: Medial: 270/90 300/10 Transverse: 200/40 Right medial: 250/60 x2 Transverse 200/40 The patient ambulated these, and they were very comfortable.

## 2017-04-30 ENCOUNTER — Encounter: Payer: Self-pay | Admitting: Family Medicine

## 2017-04-30 ENCOUNTER — Ambulatory Visit: Payer: 59 | Admitting: Family Medicine

## 2017-04-30 DIAGNOSIS — M2142 Flat foot [pes planus] (acquired), left foot: Secondary | ICD-10-CM | POA: Diagnosis not present

## 2017-04-30 DIAGNOSIS — M2141 Flat foot [pes planus] (acquired), right foot: Secondary | ICD-10-CM

## 2017-04-30 NOTE — Assessment & Plan Note (Signed)
Placed in custom orthotics today.  I think he will be beneficial.  We will increase wear over the course of next week.  Discussed the changes can be made.  Will call if necessary.  Follow-up for other chronic problems in 4 to 6 weeks

## 2017-05-07 ENCOUNTER — Encounter: Payer: Self-pay | Admitting: Gastroenterology

## 2017-05-28 MED FILL — OMEPRAZOLE 20 MG CAP: 20 | 90 days supply | Qty: 180 | Fill #1

## 2017-06-04 ENCOUNTER — Encounter (INDEPENDENT_AMBULATORY_CARE_PROVIDER_SITE_OTHER): Payer: Self-pay | Admitting: Orthopaedic Surgery

## 2017-06-04 ENCOUNTER — Ambulatory Visit (INDEPENDENT_AMBULATORY_CARE_PROVIDER_SITE_OTHER): Payer: 59 | Admitting: Orthopaedic Surgery

## 2017-06-04 ENCOUNTER — Ambulatory Visit (INDEPENDENT_AMBULATORY_CARE_PROVIDER_SITE_OTHER): Payer: 59

## 2017-06-04 DIAGNOSIS — G8929 Other chronic pain: Secondary | ICD-10-CM | POA: Diagnosis not present

## 2017-06-04 DIAGNOSIS — M25561 Pain in right knee: Secondary | ICD-10-CM

## 2017-06-04 MED ORDER — DICLOFENAC SODIUM 1 % TD GEL: 2.0000 g | Freq: Four times a day (QID) | TRANSDERMAL | 5 refills | Status: DC

## 2017-06-04 MED ORDER — CELECOXIB 200 MG PO CAPS: 200.0000 mg | ORAL_CAPSULE | Freq: Two times a day (BID) | ORAL | 3 refills | Status: DC

## 2017-06-04 MED FILL — DICLOFENAC SODIUM 1% GEL: 1 | 13 days supply | Qty: 100 | Fill #0

## 2017-06-04 MED FILL — CELECOXIB 200 MG CAP: 200 | 30 days supply | Qty: 60 | Fill #0

## 2017-06-04 NOTE — Progress Notes (Signed)
Office Visit Note   Patient: Elizabeth Brooks           Date of Birth: 1960-03-06           MRN: [id number] Visit Date: 06/04/2017              Requested by: Flossie Buffy, NP [place], [place] [id number] PCP: Flossie Buffy, NP   Assessment & Plan: Visit Diagnoses:  1. Chronic pain of right knee     Plan: Impression is right knee degenerative joint disease.  We discussed nonsurgical treatment options.  She would like to start with Celebrex and diclofenac gel.  She has lost a lot of weight recently through diet and exercise which is great.  She would like to hold off on cortisone injections for now.  She is very active and still exercises.  I will see her back as needed.  Follow-Up Instructions: Return if symptoms worsen or fail to improve.   Orders:  Orders Placed This Encounter  Procedures  . XR KNEE 3 VIEW RIGHT   Meds ordered this encounter  Medications  . celecoxib (CELEBREX) 200 MG capsule    Sig: Take 1 capsule (200 mg total) by mouth 2 (two) times daily.    Dispense:  60 capsule    Refill:  3  . diclofenac sodium (VOLTAREN) 1 % GEL    Sig: Apply 2 g topically 4 (four) times daily.    Dispense:  1 Tube    Refill:  5      Procedures: No procedures performed   Clinical Data: No additional findings.   Subjective: Chief Complaint  Patient presents with  . Right Knee - Pain    Patient is a female comes in with chronic right knee pain.  She endorses start up stiffness, night pain, constant achy pain that refers down into her shin.  Denies any mechanical symptoms.  Denies any.  She is status post partial medial meniscectomy about 10 years ago.  She has taken some Motrin and Tylenol with partial relief.   Review of Systems  Constitutional: Negative.   HENT: Negative.   Eyes: Negative.   Respiratory: Negative.   Cardiovascular: Negative.   Endocrine: Negative.   Musculoskeletal: Negative.   Neurological: Negative.     Hematological: Negative.   Psychiatric/Behavioral: Negative.   All other systems reviewed and are negative.    Objective: Vital Signs: LMP 10/07/2014 (Approximate)   Physical Exam  Constitutional: She is oriented to person, place, and time. She appears well-developed and well-nourished.  HENT:  Head: Normocephalic and atraumatic.  Eyes: EOM are normal.  Neck: Neck supple.  Pulmonary/Chest: Effort normal.  Abdominal: Soft.  Neurological: She is alert and oriented to person, place, and time.  Skin: Skin is warm. Capillary refill takes less than 2 seconds.  Psychiatric: She has a normal mood and affect. Her behavior is normal. Judgment and thought content normal.  Nursing note and vitals reviewed.   Ortho Exam Right knee exam no joint effusion.  Collaterals and cruciates are stable.  Normal range of motion.  2+ patellofemoral crepitus. Specialty Comments:  No specialty comments available.  Imaging: Xr Knee 3 View Right  Result Date: 06/04/2017 Tricompartmental degenerative joint disease with joint space narrowing.  Significant osteophytosis of patellofemoral compartment    PMFS History: Patient Active Problem List   Diagnosis Date Noted  . Subluxation of extensor carpi ulnaris tendon, right, initial encounter 04/10/2017  . Fibroid   . Patellofemoral syndrome of  both knees 10/19/2015  . Pes planus 10/19/2015  . Generalized anxiety disorder 08/23/2015  . Environmental allergies 08/23/2015  . Irritable bowel syndrome with constipation 12/29/2014  . Osteoarthritis 12/28/2014  . GERD (gastroesophageal reflux disease) 02/06/2011   Past Medical History:  Diagnosis Date  . Fibroid   . Gastroesophageal reflux   . Hyperlipidemia   . Hypertension   . IBS (irritable bowel syndrome)   . Osteoarthritis    both knees  . Prediabetes     Family History  Problem Relation Age of Onset  . Hyperlipidemia Mother   . Hypertension Mother   . Stroke Sister   . Heart disease  Maternal Grandmother   . Colon cancer Neg Hx     Past Surgical History:  Procedure Laterality Date  . CESAREAN SECTION     x 2  . EYE SURGERY Right   . KNEE ARTHROSCOPY Right   . KNEE SURGERY Right    removal of large cyst-non-cancerous  . TONSILLECTOMY    . UMBILICAL HERNIA REPAIR    . WISDOM TOOTH EXTRACTION     Social History   Occupational History  . Occupation: Therapist, 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   Tobacco Use  . Smoking status: Former Smoker    Packs/day: 1.00    Years: 26.00    Pack years: 26.00    Types: Cigarettes  . Smokeless tobacco: Never Used  Substance and Sexual Activity  . Alcohol use: No  . Drug use: No  . Sexual activity: Yes    Partners: Male    Birth control/protection: None, Post-menopausal

## 2017-06-08 DIAGNOSIS — S00451A Superficial foreign body of right ear, initial encounter: Secondary | ICD-10-CM | POA: Diagnosis not present

## 2017-06-15 DIAGNOSIS — H524 Presbyopia: Secondary | ICD-10-CM | POA: Diagnosis not present

## 2017-06-15 MED FILL — LINZESS 290 MCG CAPSULE: 290 | 90 days supply | Qty: 90 | Fill #1

## 2017-08-04 ENCOUNTER — Encounter: Payer: Self-pay | Admitting: Gastroenterology

## 2017-09-11 ENCOUNTER — Telehealth: Payer: Self-pay | Admitting: *Deleted

## 2017-09-11 NOTE — Telephone Encounter (Signed)
Spoke with pt and informed her that Dr. Silverio Decamp reviewed her records and recommends a repeat colonoscopy in 2024.  PV and procedure cancelled for now and recall put in for 2024

## 2017-09-11 NOTE — Telephone Encounter (Signed)
Its 2024 based on my note, polyp removed was not adenoma. Though she was recommended 5 years byr [place], according to new GI guidelines she doesn't need 5 year recall.  Thanks  VN   Previous Messages    ----- Message -----  From: [name], RN  Sent: 09/11/2017  9:02 AM EDT  To: Mauri Pole, MD   Dr. Silverio Decamp,   Is this patient due for a colonoscopy now or in 2024? I just wanted to make sure she does/doesn't need it now...   Thanks,  Cyril Mourning      See above note.  Attempted to reach pt to let her know that she is not due for a colonoscopy until 2024.  LMOM to call office back

## 2017-09-25 ENCOUNTER — Encounter: Payer: 59 | Admitting: Gastroenterology

## 2017-10-06 NOTE — Progress Notes (Signed)
57 y.o. G66P2002 Married Other or two or more races Not Hispanic or Latino female here for annual exam.   No vaginal bleeding, no dyspareunia. No significant vasomotor symptoms.  She has IBS, tends towards constipation. She takes linzess. No urinary c/o. She just ran a 5 K.     Patient's last menstrual period was 10/07/2014 (approximate).          Sexually active: Yes.    The current method of family planning is post menopausal status.    Exercising: Yes.    walking, aerobics Smoker:  no  Health Maintenance: Pap:  10/19/2014 normal History of abnormal Pap:  no MMG:  11/17/2016 Birads 1 negative Colonoscopy: 07-15-12 polyps, f/u in 10 years.  TDaP:  01/15/2011 Gardasil: N/A   reports that she has quit smoking. Her smoking use included cigarettes. She has a 26.00 pack-year smoking history. She has never used smokeless tobacco. She reports that she does not drink alcohol or use drugs. She works for [employer], in [place]. 2 grown kids, one in [place], one in [place]. 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 Both with degrees in music. Former Licensed conveyancer.  Husband is a MD in [employer]  Past Medical History:  Diagnosis Date  . Fibroid   . Gastroesophageal reflux   . Hyperlipidemia   . Hypertension   . IBS (irritable bowel syndrome)   . Osteoarthritis    both knees  . Prediabetes   She was previously on medication for HTN, no medication for a couple of years with weight and stress reduction.   Past Surgical History:  Procedure Laterality Date  . CESAREAN SECTION     x 2  . EYE SURGERY Right   . KNEE ARTHROSCOPY Right   . KNEE SURGERY Right    removal of large cyst-non-cancerous  . TONSILLECTOMY    . UMBILICAL HERNIA REPAIR    . WISDOM TOOTH EXTRACTION      Current Outpatient Medications  Medication Sig Dispense Refill  . celecoxib (CELEBREX) 200 MG capsule Take 1 capsule (200 mg total) by mouth 2 (two) times daily. 60 capsule 3  . linaclotide (LINZESS) 290 MCG CAPS capsule Take 1 capsule (290 mcg total) by  mouth daily before breakfast. 30 capsule 11  . omeprazole (PRILOSEC) 20 MG capsule TAKE 1 CAPSULE (20 MG TOTAL) BY MOUTH 2 (TWO) TIMES DAILY BEFORE A MEAL. 180 capsule 3  . TURMERIC PO Take 1 capsule by mouth 2 (two) times daily.     No current facility-administered medications for this visit.     Family History  Problem Relation Age of Onset  . Hyperlipidemia Mother   . Hypertension Mother   . Stroke Sister   . Heart disease Maternal Grandmother   . Colon cancer Neg Hx     Review of Systems  Constitutional: Negative.   HENT: Negative.   Eyes: Negative.   Respiratory: Negative.   Cardiovascular: Negative.   Gastrointestinal: Positive for constipation.  Endocrine: Negative.   Genitourinary: Negative.   Musculoskeletal:       Joint pain  Skin: Negative.   Allergic/Immunologic: Negative.   Neurological: Negative.   Hematological: Negative.   Psychiatric/Behavioral: Negative.     Exam:   BP 136/88 (BP Location: Right Arm, Patient Position: Sitting, Cuff Size: Normal)   Pulse 64   Ht 5' 4.76" (1.645 m)   Wt 183 lb 9.6 oz (83.3 kg)   LMP 10/07/2014 (Approximate)   BMI 30.78 kg/m   Weight change: @WEIGHTCHANGE @ Height:   Height: 5' 4.76" (  164.5 cm)  Ht Readings from Last 3 Encounters:  10/12/17 5' 4.76" (1.645 m)  04/13/17 5\' 4"  (1.626 m)  04/10/17 5\' 4"  (1.626 m)    General appearance: alert, cooperative and appears stated age Head: Normocephalic, without obvious abnormality, atraumatic Neck: no adenopathy, supple, symmetrical, trachea midline and thyroid normal to inspection and palpation Lungs: clear to auscultation bilaterally Cardiovascular: regular rate and rhythm Breasts: normal appearance, no masses or tenderness Abdomen: soft, non-tender; non distended,  no masses,  no organomegaly Extremities: extremities normal, atraumatic, no cyanosis or edema Skin: Skin color, texture, turgor normal. No rashes or lesions Lymph nodes: Cervical, supraclavicular, and  axillary nodes normal. No abnormal inguinal nodes palpated Neurologic: Grossly normal   Pelvic: External genitalia:  no lesions              Urethra:  normal appearing urethra with no masses, tenderness or lesions              Bartholins and Skenes: normal                 Vagina: normal appearing vagina with normal color and discharge, no lesions              Cervix: no lesions               Bimanual Exam:  Uterus:  normal size, contour, position, consistency, mobility, non-tender              Adnexa: no mass, fullness, tenderness               Rectovaginal: Confirms               Anus:  normal sphincter tone, no lesions  Chaperone was present for exam.  A:  Well Woman with normal exam  P:   Pap with hpv  Discussed breast self exam  Discussed calcium and vit D intake  Mammogram next month  Labs with primary  Colonoscopy UTD.

## 2017-10-12 ENCOUNTER — Encounter: Payer: Self-pay | Admitting: Obstetrics and Gynecology

## 2017-10-12 ENCOUNTER — Ambulatory Visit (INDEPENDENT_AMBULATORY_CARE_PROVIDER_SITE_OTHER): Payer: 59 | Admitting: Obstetrics and Gynecology

## 2017-10-12 ENCOUNTER — Other Ambulatory Visit: Payer: Self-pay

## 2017-10-12 ENCOUNTER — Other Ambulatory Visit (HOSPITAL_COMMUNITY)
Admission: RE | Admit: 2017-10-12 | Discharge: 2017-10-12 | Disposition: A | Discharge status: Home / Self Care | Payer: 59 | Source: Ambulatory Visit | Attending: Obstetrics and Gynecology | Admitting: Obstetrics and Gynecology

## 2017-10-12 VITALS — BP 136/88 | HR 64 | Ht 64.76 in | Wt 183.6 lb

## 2017-10-12 DIAGNOSIS — Z124 Encounter for screening for malignant neoplasm of cervix: Secondary | ICD-10-CM | POA: Diagnosis not present

## 2017-10-12 DIAGNOSIS — Z01419 Encounter for gynecological examination (general) (routine) without abnormal findings: Secondary | ICD-10-CM | POA: Diagnosis not present

## 2017-10-12 NOTE — Patient Instructions (Signed)
EXERCISE AND DIET:  We recommended that you start or continue a regular exercise program for good health. Regular exercise means any activity that makes your heart beat faster and makes you sweat.  We recommend exercising at least 30 minutes per day at least 3 days a week, preferably 4 or 5.  We also recommend a diet low in fat and sugar.  Inactivity, poor dietary choices and obesity can cause diabetes, heart attack, stroke, and kidney damage, among others.    ALCOHOL AND SMOKING:  Women should limit their alcohol intake to no more than 7 drinks/beers/glasses of wine (combined, not each!) per week. Moderation of alcohol intake to this level decreases your risk of breast cancer and liver damage. And of course, no recreational drugs are part of a healthy lifestyle.  And absolutely no smoking or even second hand smoke. Most people know smoking can cause heart and lung diseases, but did you know it also contributes to weakening of your bones? Aging of your skin?  Yellowing of your teeth and nails?  CALCIUM AND VITAMIN D:  Adequate intake of calcium and Vitamin D are recommended.  The recommendations for exact amounts of these supplements seem to change often, but generally speaking 600 mg of calcium (either carbonate or citrate) and 800 units of Vitamin D per day seems prudent. Certain women may benefit from higher intake of Vitamin D.  If you are among these women, your doctor will have told you during your visit.    PAP SMEARS:  Pap smears, to check for cervical cancer or precancers,  have traditionally been done yearly, although recent scientific advances have shown that most women can have pap smears less often.  However, every woman still should have a physical exam from her gynecologist every year. It will include a breast check, inspection of the vulva and vagina to check for abnormal growths or skin changes, a visual exam of the cervix, and then an exam to evaluate the size and shape of the uterus and  ovaries.  And after 55 years of age, a rectal exam is indicated to check for rectal cancers. We will also provide age appropriate advice regarding health maintenance, like when you should have certain vaccines, screening for sexually transmitted diseases, bone density testing, colonoscopy, mammograms, etc.   MAMMOGRAMS:  All women over 40 years old should have a yearly mammogram. Many facilities now offer a "3D" mammogram, which may cost around $50 extra out of pocket. If possible,  we recommend you accept the option to have the 3D mammogram performed.  It both reduces the number of women who will be called back for extra views which then turn out to be normal, and it is better than the routine mammogram at detecting truly abnormal areas.    COLONOSCOPY:  Colonoscopy to screen for colon cancer is recommended for all women at age 50.  We know, you hate the idea of the prep.  We agree, BUT, having colon cancer and not knowing it is worse!!  Colon cancer so often starts as a polyp that can be seen and removed at colonscopy, which can quite literally save your life!  And if your first colonoscopy is normal and you have no family history of colon cancer, most women don't have to have it again for 10 years.  Once every ten years, you can do something that may end up saving your life, right?  We will be happy to help you get it scheduled when you are ready.    Be sure to check your insurance coverage so you understand how much it will cost.  It may be covered as a preventative service at no cost, but you should check your particular policy.      Breast Self-Awareness Breast self-awareness means being familiar with how your breasts look and feel. It involves checking your breasts regularly and reporting any changes to your health care provider. Practicing breast self-awareness is important. A change in your breasts can be a sign of a serious medical problem. Being familiar with how your breasts look and feel allows  you to find any problems early, when treatment is more likely to be successful. All women should practice breast self-awareness, including women who have had breast implants. How to do a breast self-exam One way to learn what is normal for your breasts and whether your breasts are changing is to do a breast self-exam. To do a breast self-exam: Look for Changes  1. Remove all the clothing above your waist. 2. Stand in front of a mirror in a room with good lighting. 3. Put your hands on your hips. 4. Push your hands firmly downward. 5. Compare your breasts in the mirror. Look for differences between them (asymmetry), such as: ? Differences in shape. ? Differences in size. ? Puckers, dips, and bumps in one breast and not the other. 6. Look at each breast for changes in your skin, such as: ? Redness. ? Scaly areas. 7. Look for changes in your nipples, such as: ? Discharge. ? Bleeding. ? Dimpling. ? Redness. ? A change in position. Feel for Changes  Carefully feel your breasts for lumps and changes. It is best to do this while lying on your back on the floor and again while sitting or standing in the shower or tub with soapy water on your skin. Feel each breast in the following way:  Place the arm on the side of the breast you are examining above your head.  Feel your breast with the other hand.  Start in the nipple area and make  inch (2 cm) overlapping circles to feel your breast. Use the pads of your three middle fingers to do this. Apply light pressure, then medium pressure, then firm pressure. The light pressure will allow you to feel the tissue closest to the skin. The medium pressure will allow you to feel the tissue that is a little deeper. The firm pressure will allow you to feel the tissue close to the ribs.  Continue the overlapping circles, moving downward over the breast until you feel your ribs below your breast.  Move one finger-width toward the center of the body.  Continue to use the  inch (2 cm) overlapping circles to feel your breast as you move slowly up toward your collarbone.  Continue the up and down exam using all three pressures until you reach your armpit.  Write Down What You Find  Write down what is normal for each breast and any changes that you find. Keep a written record with breast changes or normal findings for each breast. By writing this information down, you do not need to depend only on memory for size, tenderness, or location. Write down where you are in your menstrual cycle, if you are still menstruating. If you are having trouble noticing differences in your breasts, do not get discouraged. With time you will become more familiar with the variations in your breasts and more comfortable with the exam. How often should I examine my breasts? Examine   your breasts every month. If you are breastfeeding, the best time to examine your breasts is after a feeding or after using a breast pump. If you menstruate, the best time to examine your breasts is 5-7 days after your period is over. During your period, your breasts are lumpier, and it may be more difficult to notice changes. When should I see my health care provider? See your health care provider if you notice:  A change in shape or size of your breasts or nipples.  A change in the skin of your breast or nipples, such as a reddened or scaly area.  Unusual discharge from your nipples.  A lump or thick area that was not there before.  Pain in your breasts.  Anything that concerns you.  This information is not intended to replace advice given to you by your health care provider. Make sure you discuss any questions you have with your health care provider. Document Released: 12/23/2004 Document Revised: 05/31/2015 Document Reviewed: 11/12/2014 Elsevier Interactive Patient Education  2018 [employer]  

## 2017-10-14 LAB — CYTOLOGY - PAP
DIAGNOSIS: NEGATIVE
HPV (WINDOPATH): NOT DETECTED

## 2017-10-16 MED FILL — CELECOXIB 200 MG CAP: 200 | 30 days supply | Qty: 60 | Fill #1

## 2017-11-04 ENCOUNTER — Other Ambulatory Visit: Payer: Self-pay | Admitting: Physician Assistant

## 2017-11-04 MED FILL — LINZESS 290 MCG CAPSULE: 290 | 30 days supply | Qty: 30 | Fill #0

## 2017-11-30 MED FILL — OMEPRAZOLE 20 MG CPDR: 20 | 90 days supply | Qty: 180 | Fill #2

## 2017-12-15 MED FILL — LINZESS 290 MCG CAPSULE: 290 | 90 days supply | Qty: 90 | Fill #1

## 2017-12-22 MED FILL — ATOVAQUONE-PROGUANIL 250-10: 250-100 | 10 days supply | Qty: 10 | Fill #0

## 2017-12-22 MED FILL — AZITHROMYCIN 500 MG TABLET: 500 | 3 days supply | Qty: 4 | Fill #0

## 2017-12-28 MED FILL — CELECOXIB 200 MG CAP: 200 | 30 days supply | Qty: 60 | Fill #2

## 2018-02-04 ENCOUNTER — Encounter (INDEPENDENT_AMBULATORY_CARE_PROVIDER_SITE_OTHER): Payer: 59

## 2018-02-16 ENCOUNTER — Ambulatory Visit (INDEPENDENT_AMBULATORY_CARE_PROVIDER_SITE_OTHER): Payer: 59 | Admitting: Family Medicine

## 2018-02-16 ENCOUNTER — Encounter (INDEPENDENT_AMBULATORY_CARE_PROVIDER_SITE_OTHER): Payer: Self-pay | Admitting: Family Medicine

## 2018-02-16 VITALS — BP 129/76 | HR 64 | Temp 97.8°F | Ht 64.0 in | Wt 185.0 lb

## 2018-02-16 DIAGNOSIS — E669 Obesity, unspecified: Secondary | ICD-10-CM

## 2018-02-16 DIAGNOSIS — Z9189 Other specified personal risk factors, not elsewhere classified: Secondary | ICD-10-CM | POA: Diagnosis not present

## 2018-02-16 DIAGNOSIS — Z1331 Encounter for screening for depression: Secondary | ICD-10-CM | POA: Diagnosis not present

## 2018-02-16 DIAGNOSIS — I10 Essential (primary) hypertension: Secondary | ICD-10-CM | POA: Diagnosis not present

## 2018-02-16 DIAGNOSIS — R7303 Prediabetes: Secondary | ICD-10-CM | POA: Diagnosis not present

## 2018-02-16 DIAGNOSIS — R5383 Other fatigue: Secondary | ICD-10-CM

## 2018-02-16 DIAGNOSIS — Z6831 Body mass index (BMI) 31.0-31.9, adult: Secondary | ICD-10-CM

## 2018-02-16 DIAGNOSIS — R0602 Shortness of breath: Secondary | ICD-10-CM

## 2018-02-16 DIAGNOSIS — Z0289 Encounter for other administrative examinations: Secondary | ICD-10-CM

## 2018-02-17 NOTE — Progress Notes (Signed)
.  Office: [phone]  /  Fax: [phone]   HPI:   Chief Complaint: [place] (MR# [id number]) is a 58 y.o. female who presents on 02/16/2018 for obesity evaluation and treatment. Current BMI is Body mass index is 31.76 kg/m.Elizabeth Brooks has struggled with obesity for years and has been unsuccessful in either losing weight or maintaining long term weight loss. Jermaine attended our information session and states she is currently in the action stage of change and ready to dedicate time achieving and maintaining a healthier weight.   Lenola is a [employer].  Vail states her family eats meals together her desired weight loss is 35 lbs she has been heavy most of  her life she started gaining weight during pregnancy her heaviest weight ever was 229 lbs she is a picky eater and doesn't like to eat healthier foods  she has significant food cravings issues  she snacks frequently in the evenings she skips meals frequently she is frequently drinking liquids with calories she struggles with emotional eating    Fatigue Dula feels her energy is lower than it should be. This has worsened with weight gain and has not worsened recently. Charlene admits to daytime somnolence and  admits to waking up still tired. Patient is at risk for obstructive sleep apnea. Patent has a history of symptoms of daytime fatigue. Patient generally gets 6 hours of sleep per night, and states they generally have nightime awakenings. Snoring is not present. Apneic episodes are not present. Epworth Sleepiness Score is 4.  Dyspnea on exertion Denette notes increasing shortness of breath with exercising and seems to be worsening over time with weight gain. She notes getting out of breath sooner with activity than she used to. This has not gotten worse recently. EKG-Bradycardia at 101 BPM. Clarabelle denies orthopnea.  Pre-Diabetes Mandisa has a diagnosis of pre-diabetes based on her elevated Hgb A1c  of 6.0 1 and a half years ago. She was informed this puts her at greater risk of developing diabetes. She is not taking metformin currently and notes minimal carbohydrate cravings. She continues to work on diet and exercise to decrease risk of diabetes. She denies nausea or hypoglycemia.  At risk for diabetes Xoey is at higher than average risk for developing diabetes due to her obesity and pre-diabetes. She currently denies polyuria or polydipsia.  Hypertension Blessings Redondo is a 58 y.o. female with hypertension. Reianna's blood pressure is controlled today. She denies chest pain, chest pressure, or headaches. She is working on weight loss to help control her blood pressure with the goal of decreasing her risk of heart attack and stroke.   Depression Screen Pearlina's Food and Mood (modified PHQ-9) score was  Depression screen PHQ 2/9 02/16/2018  Decreased Interest 3  Down, Depressed, Hopeless 1  PHQ - 2 Score 4  Altered sleeping 0  Tired, decreased energy 3  Change in appetite 3  Feeling bad or failure about yourself  3  Trouble concentrating 1  Moving slowly or fidgety/restless 0  Suicidal thoughts 0  PHQ-9 Score 14    ASSESSMENT AND PLAN:  Other fatigue - Plan: EKG 12-Lead, Vitamin B12, CBC With Differential, Folate, T3, T4, free, TSH, VITAMIN D 25 Hydroxy (Vit-D Deficiency, Fractures)  Shortness of breath on exertion - Plan: Lipid Panel With LDL/HDL Ratio  Prediabetes - Plan: Comprehensive metabolic panel, Hemoglobin A1c, Insulin, random  Essential hypertension - Plan: Comprehensive metabolic panel  Depression screening  At risk for diabetes mellitus  Class  1 obesity with serious comorbidity and body mass index (BMI) of 31.0 to 31.9 in adult, unspecified obesity type  PLAN:  Fatigue Justin was informed that her fatigue may be related to obesity, depression or many other causes. Labs will be ordered, and in the meanwhile Tashay has agreed to work on diet, exercise  and weight loss to help with fatigue. Proper sleep hygiene was discussed including the need for 7-8 hours of quality sleep each night. A sleep study was not ordered based on symptoms and Epworth score.  Dyspnea on exertion Sefora's shortness of breath appears to be obesity related and exercise induced. She has agreed to work on weight loss and gradually increase exercise to treat her exercise induced shortness of breath. If Milicent follows our instructions and loses weight without improvement of her shortness of breath, we will plan to refer to pulmonology. We will monitor this condition regularly. Stephanie agrees to this plan.  Pre-Diabetes Daveah will continue to work on weight loss, exercise, and decreasing simple carbohydrates in her diet to help decrease the risk of diabetes. We dicussed metformin including benefits and risks. She was informed that eating too many simple carbohydrates or too many calories at one sitting increases the likelihood of GI side effects. Anesia declined metformin for now and a prescription was not written today. We will check Hgb A1c and insulin level today. [place] agrees to follow up with our clinic in 2 weeks as directed to monitor her progress.  Diabetes risk counseling Dacy was given extended (15 minutes) diabetes prevention counseling today. She is 58 y.o. female and has risk factors for diabetes including obesity and pre-diabetes. We discussed intensive lifestyle modifications today with an emphasis on weight loss as well as increasing exercise and decreasing simple carbohydrates in her diet.  Hypertension We discussed sodium restriction, working on healthy weight loss, and a regular exercise program as the means to achieve improved blood pressure control. Inge agreed with this plan and agreed to follow up as directed. We will continue to monitor her blood pressure as well as her progress with the above lifestyle modifications. She will watch for signs of  hypotension as she continues her lifestyle modifications. EKG was done, and we will check CMP today. Odelia agrees to follow up with our clinic in 2 weeks.  Depression Screen Adonna had a moderately positive depression screening. Depression is commonly associated with obesity and often results in emotional eating behaviors. We will monitor this closely and work on CBT to help improve the non-hunger eating patterns. Referral to Psychology may be required if no improvement is seen as she continues in our clinic.  Obesity Carlisha is currently in the action stage of change and her goal is to continue with weight loss efforts She has agreed to follow the Category 2 plan + 100 calories Yarenis has been instructed to work up to a goal of 150 minutes of combined cardio and strengthening exercise per week for weight loss and overall health benefits. We discussed the following Behavioral Modification Strategies today: increasing lean protein intake, increasing vegetables, work on meal planning and easy cooking plans, better snacking choices, and planning for success  Meilin has agreed to follow up with our clinic in 2 weeks. She was informed of the importance of frequent follow up visits to maximize her success with intensive lifestyle modifications for her multiple health conditions. She was informed we would discuss her lab results at her next visit unless there is a critical issue that needs to  be addressed sooner. Scotlyn agreed to keep her next visit at the agreed upon time to discuss these results.  ALLERGIES: Allergies  Allergen Reactions  . Penicillins Anaphylaxis and Rash    MEDICATIONS: Current Outpatient Medications on File Prior to Visit  Medication Sig Dispense Refill  . caffeine 200 MG TABS tablet Take 200 mg by mouth every 4 (four) hours as needed.    . celecoxib (CELEBREX) 200 MG capsule Take 1 capsule (200 mg total) by mouth 2 (two) times daily. 60 capsule 3  . linaclotide  (LINZESS) 290 MCG CAPS capsule Take 1 capsule (290 mcg total) by mouth daily before breakfast. 30 capsule 6  . omeprazole (PRILOSEC) 20 MG capsule TAKE 1 CAPSULE (20 MG TOTAL) BY MOUTH 2 (TWO) TIMES DAILY BEFORE A MEAL. 180 capsule 3   No current facility-administered medications on file prior to visit.     PAST MEDICAL HISTORY: Past Medical History:  Diagnosis Date  . Anxiety   . Fibroid   . Gastroesophageal reflux   . GERD (gastroesophageal reflux disease)   . Hyperlipidemia   . Hypertension   . IBS (irritable bowel syndrome)   . Joint pain   . Osteoarthritis    both knees  . Prediabetes   . Prediabetes     PAST SURGICAL HISTORY: Past Surgical History:  Procedure Laterality Date  . CESAREAN SECTION     x 2  . EYE SURGERY Right   . KNEE ARTHROSCOPY Right   . KNEE SURGERY Right    removal of large cyst-non-cancerous  . TONSILLECTOMY    . UMBILICAL HERNIA REPAIR    . WISDOM TOOTH EXTRACTION      SOCIAL HISTORY: Social History   Tobacco Use  . Smoking status: Former Smoker    Packs/day: 1.00    Years: 26.00    Pack years: 26.00    Types: Cigarettes  . Smokeless tobacco: Never Used  Substance Use Topics  . Alcohol use: No  . Drug use: No    FAMILY HISTORY: Family History  Problem Relation Age of Onset  . Hyperlipidemia Mother   . Hypertension Mother   . Diabetes Mother   . Stroke Sister   . Heart disease Maternal Grandmother   . Colon cancer Neg Hx     ROS: Review of Systems  Constitutional: Positive for malaise/fatigue. Negative for weight loss.       + Trouble sleeping  HENT:       + Dry mouth  Eyes:       + Wear glasses or contacts  Respiratory: Positive for shortness of breath (with exertion).   Cardiovascular: Negative for chest pain and orthopnea.       Negative chest pressure  Gastrointestinal: Positive for constipation.  Genitourinary: Negative for frequency.  Neurological: Negative for headaches.  Endo/Heme/Allergies: Negative for  polydipsia.       Negative hypoglycemia    PHYSICAL EXAM: Blood pressure 129/76, pulse 64, temperature 97.8 F (36.6 C), temperature source Oral, height 5\' 4"  (1.626 m), weight 185 lb (83.9 kg), last menstrual period 10/07/2014, SpO2 98 %. Body mass index is 31.76 kg/m. Physical Exam Vitals signs reviewed.  Constitutional:      Appearance: Normal appearance. She is obese.  HENT:     Head: Normocephalic and atraumatic.     Nose: Nose normal.  Eyes:     General: No scleral icterus.    Extraocular Movements: Extraocular movements intact.  Neck:     Musculoskeletal: Normal range of motion and  neck supple.     Comments: No thyromegaly present Cardiovascular:     Rate and Rhythm: Regular rhythm. Bradycardia present.     Pulses: Normal pulses.     Heart sounds: Normal heart sounds.  Pulmonary:     Effort: Pulmonary effort is normal. No respiratory distress.     Breath sounds: Normal breath sounds.  Abdominal:     Palpations: Abdomen is soft.     Tenderness: There is no abdominal tenderness.     Comments: + Obesity  Musculoskeletal: Normal range of motion.     Right lower leg: No edema.     Left lower leg: No edema.  Skin:    General: Skin is warm and dry.  Neurological:     Mental Status: She is alert and oriented to person, place, and time.     Coordination: Coordination normal.  Psychiatric:        Mood and Affect: Mood normal.        Behavior: Behavior normal.     RECENT LABS AND TESTS: BMET    Component Value Date/Time   NA 140 02/16/2018 1030   K 4.5 02/16/2018 1030   CL 102 02/16/2018 1030   CO2 21 02/16/2018 1030   GLUCOSE 99 02/16/2018 1030   GLUCOSE 99 10/08/2016 0939   BUN 14 02/16/2018 1030   CREATININE 0.79 02/16/2018 1030   CALCIUM 9.0 02/16/2018 1030   GFRNONAA 84 02/16/2018 1030   GFRAA 97 02/16/2018 1030   Lab Results  Component Value Date   HGBA1C 5.9 (H) 02/16/2018   Lab Results  Component Value Date   INSULIN 8.3 02/16/2018   CBC      Component Value Date/Time   WBC 6.5 02/16/2018 1030   WBC 7.7 10/08/2016 0939   RBC 4.64 02/16/2018 1030   RBC 4.71 10/08/2016 0939   HGB 12.8 02/16/2018 1030   HCT 38.1 02/16/2018 1030   PLT 311.0 10/08/2016 0939   MCV 82 02/16/2018 1030   MCH 27.6 02/16/2018 1030   MCHC 33.6 02/16/2018 1030   MCHC 32.3 10/08/2016 0939   RDW 14.9 02/16/2018 1030   LYMPHSABS 2.4 02/16/2018 1030   EOSABS 0.2 02/16/2018 1030   BASOSABS 0.0 02/16/2018 1030   Iron/TIBC/Ferritin/ %Sat No results found for: IRON, TIBC, FERRITIN, IRONPCTSAT Lipid Panel     Component Value Date/Time   CHOL 240 (H) 02/16/2018 1030   TRIG 92 02/16/2018 1030   HDL 61 02/16/2018 1030   CHOLHDL 5 10/08/2016 0939   VLDL 23.2 10/08/2016 0939   LDLCALC 161 (H) 02/16/2018 1030   Hepatic Function Panel     Component Value Date/Time   PROT 7.1 02/16/2018 1030   ALBUMIN 4.4 02/16/2018 1030   AST 29 02/16/2018 1030   ALT 23 02/16/2018 1030   ALKPHOS 75 02/16/2018 1030   BILITOT 0.3 02/16/2018 1030      Component Value Date/Time   TSH 1.950 02/16/2018 1030    ECG  shows NSR with a rate of 58 BPM INDIRECT CALORIMETER done today shows a VO2 of 224 and a REE of 1560. Her calculated basal metabolic rate is 7209 thus her basal metabolic rate is better than expected.       OBESITY BEHAVIORAL INTERVENTION VISIT  Today's visit was # 1   Starting weight: 185 lbs Starting date: 02/16/2018 Today's weight : 185 lbs  Today's date: 02/16/2018 Total lbs lost to date: 0    ASK: We discussed the diagnosis of obesity with Raymondo Band today and  Lulubelle agreed to give [place] permission to discuss obesity behavioral modification therapy today.  ASSESS: Malka has the diagnosis of obesity and her BMI today is 31.74 Kenniyah is in the action stage of change   ADVISE: Sariyah was educated on the multiple health risks of obesity as well as the benefit of weight loss to improve her health. She was advised of the need for long  term treatment and the importance of lifestyle modifications to improve her current health and to decrease her risk of future health problems.  AGREE: Multiple dietary modification options and treatment options were discussed and  Jadin agreed to follow the recommendations documented in the above note.  ARRANGE: Alieah was educated on the importance of frequent visits to treat obesity as outlined per CMS and USPSTF guidelines and agreed to schedule her next follow up appointment today.   I, Trixie Dredge, am acting as transcriptionist for Ilene Qua, MD    I have reviewed the above documentation for accuracy and completeness, and I agree with the above. - Ilene Qua, MD

## 2018-02-18 LAB — CBC WITH DIFFERENTIAL
BASOS: 1 %
Basophils Absolute: 0 10*3/uL (ref 0.0–0.2)
EOS (ABSOLUTE): 0.2 10*3/uL (ref 0.0–0.4)
EOS: 3 %
HEMATOCRIT: 38.1 % (ref 34.0–46.6)
HEMOGLOBIN: 12.8 g/dL (ref 11.1–15.9)
IMMATURE GRANULOCYTES: 0 %
Immature Grans (Abs): 0 10*3/uL (ref 0.0–0.1)
LYMPHS ABS: 2.4 10*3/uL (ref 0.7–3.1)
Lymphs: 37 %
MCH: 27.6 pg (ref 26.6–33.0)
MCHC: 33.6 g/dL (ref 31.5–35.7)
MCV: 82 fL (ref 79–97)
MONOCYTES: 5 %
Monocytes Absolute: 0.4 10*3/uL (ref 0.1–0.9)
Neutrophils Absolute: 3.5 10*3/uL (ref 1.4–7.0)
Neutrophils: 54 %
RBC: 4.64 x10E6/uL (ref 3.77–5.28)
RDW: 14.9 % (ref 11.7–15.4)
WBC: 6.5 10*3/uL (ref 3.4–10.8)

## 2018-02-18 LAB — LIPID PANEL WITH LDL/HDL RATIO
CHOLESTEROL TOTAL: 240 mg/dL — AB (ref 100–199)
HDL: 61 mg/dL (ref >39)
LDL Calculated: 161 mg/dL — ABNORMAL HIGH (ref 0–99)
LDl/HDL Ratio: 2.6 ratio (ref 0.0–3.2)
Triglycerides: 92 mg/dL (ref 0–149)
VLDL CHOLESTEROL CAL: 18 mg/dL (ref 5–40)

## 2018-02-18 LAB — VITAMIN B12: VITAMIN B 12: 582 pg/mL (ref 232–1245)

## 2018-02-18 LAB — COMPREHENSIVE METABOLIC PANEL
ALBUMIN: 4.4 g/dL (ref 3.8–4.9)
ALK PHOS: 75 IU/L (ref 39–117)
ALT: 23 IU/L (ref 0–32)
AST: 29 IU/L (ref 0–40)
Albumin/Globulin Ratio: 1.6 (ref 1.2–2.2)
BUN / CREAT RATIO: 18 (ref 9–23)
BUN: 14 mg/dL (ref 6–24)
Bilirubin Total: 0.3 mg/dL (ref 0.0–1.2)
CALCIUM: 9 mg/dL (ref 8.7–10.2)
CO2: 21 mmol/L (ref 20–29)
CREATININE: 0.79 mg/dL (ref 0.57–1.00)
Chloride: 102 mmol/L (ref 96–106)
GFR calc Af Amer: 97 mL/min/{1.73_m2} (ref >59)
GFR, EST NON AFRICAN AMERICAN: 84 mL/min/{1.73_m2} (ref >59)
Globulin, Total: 2.7 g/dL (ref 1.5–4.5)
Glucose: 99 mg/dL (ref 65–99)
Potassium: 4.5 mmol/L (ref 3.5–5.2)
Sodium: 140 mmol/L (ref 134–144)
TOTAL PROTEIN: 7.1 g/dL (ref 6.0–8.5)

## 2018-02-18 LAB — TSH: TSH: 1.95 u[IU]/mL (ref 0.450–4.500)

## 2018-02-18 LAB — FOLATE: FOLATE: 2.6 ng/mL — AB (ref >3.0)

## 2018-02-18 LAB — INSULIN, RANDOM: INSULIN: 8.3 u[IU]/mL (ref 2.6–24.9)

## 2018-02-18 LAB — HEMOGLOBIN A1C
Est. average glucose Bld gHb Est-mCnc: 123 mg/dL
HEMOGLOBIN A1C: 5.9 % — AB (ref 4.8–5.6)

## 2018-02-18 LAB — T4, FREE: FREE T4: 1.15 ng/dL (ref 0.82–1.77)

## 2018-02-18 LAB — T3: T3, Total: 98 ng/dL (ref 71–180)

## 2018-02-18 LAB — VITAMIN D 25 HYDROXY (VIT D DEFICIENCY, FRACTURES): Vit D, 25-Hydroxy: 24.6 ng/mL — ABNORMAL LOW (ref 30.0–100.0)

## 2018-03-02 ENCOUNTER — Encounter (INDEPENDENT_AMBULATORY_CARE_PROVIDER_SITE_OTHER): Payer: Self-pay | Admitting: Family Medicine

## 2018-03-02 ENCOUNTER — Ambulatory Visit (INDEPENDENT_AMBULATORY_CARE_PROVIDER_SITE_OTHER): Payer: 59 | Admitting: Family Medicine

## 2018-03-02 VITALS — BP 133/80 | HR 60 | Temp 97.8°F | Ht 64.0 in | Wt 183.0 lb

## 2018-03-02 DIAGNOSIS — R7303 Prediabetes: Secondary | ICD-10-CM

## 2018-03-02 DIAGNOSIS — E559 Vitamin D deficiency, unspecified: Secondary | ICD-10-CM

## 2018-03-02 DIAGNOSIS — Z6831 Body mass index (BMI) 31.0-31.9, adult: Secondary | ICD-10-CM | POA: Diagnosis not present

## 2018-03-02 DIAGNOSIS — E669 Obesity, unspecified: Secondary | ICD-10-CM | POA: Diagnosis not present

## 2018-03-02 DIAGNOSIS — Z9189 Other specified personal risk factors, not elsewhere classified: Secondary | ICD-10-CM

## 2018-03-02 MED ORDER — METFORMIN HCL 500 MG PO TABS: 500.0000 mg | ORAL_TABLET | Freq: Every day | ORAL | 0 refills | Status: DC

## 2018-03-02 MED ORDER — VITAMIN D (ERGOCALCIFEROL) 1.25 MG (50000 UNIT) PO CAPS: 50000.0000 [IU] | ORAL_CAPSULE | ORAL | 0 refills | Status: DC

## 2018-03-02 MED FILL — metFORMIN HCL 500 MG TABS: 500 | 30 days supply | Qty: 30 | Fill #0

## 2018-03-02 MED FILL — VIT D2 1.25 MG (50,000 UNIT: 1.25 MG | 28 days supply | Qty: 4 | Fill #0

## 2018-03-03 NOTE — Progress Notes (Signed)
Office: [phone]  /  Fax: [phone]   HPI:   Chief Complaint: OBESITY Elizabeth Brooks is here to discuss her progress with her obesity treatment plan. She is on the Category 2 plan + 100 calories and is following her eating plan approximately 70 % of the time. She states she is walking for 15-30 minutes 3 times per week. Ayo went to [place] for 1 weekend to meet up with family and tried to make smart choices when eating out. She followed the plan as best as she could. She may have missed part of meals secondary to being busy at work.  Her weight is 183 lb (83 kg) today and has had a weight loss of 2 pounds over a period of 2 weeks since her last visit. She has lost 2 lbs since starting treatment with [place].  Vitamin D Deficiency Elizabeth Brooks has a diagnosis of vitamin D deficiency. She is not currently taking OTC Vit D. She notes fatigue and denies nausea, vomiting or muscle weakness.  Pre-Diabetes Elizabeth Brooks has a diagnosis of pre-diabetes based on her elevated Hgb A1c at 5.9 and she has had diagnosis for numerous years. She was informed this puts her at greater risk of developing diabetes. She is not taking metformin currently and continues to work on diet and exercise to decrease risk of diabetes. She denies nausea or hypoglycemia.  At risk for diabetes Elizabeth Brooks is at higher than average risk for developing diabetes due to her obesity and pre-diabetes. She currently denies polyuria or polydipsia.  ASSESSMENT AND PLAN:  Vitamin D deficiency - Plan: Vitamin D, Ergocalciferol, (DRISDOL) 1.25 MG (50000 UT) CAPS capsule  Prediabetes - Plan: metFORMIN (GLUCOPHAGE) 500 MG tablet  At risk for diabetes mellitus  Class 1 obesity with serious comorbidity and body mass index (BMI) of 31.0 to 31.9 in adult, unspecified obesity type  PLAN:  Vitamin D Deficiency Elizabeth Brooks was informed that low vitamin D levels contributes to fatigue and are associated with obesity, breast, and colon cancer. Elizabeth Brooks  agrees to start prescription Vit D @50 ,000 IU every week #4 with no refills. She will follow up for routine testing of vitamin D, at least 2-3 times per year. She was informed of the risk of over-replacement of vitamin D and agrees to not increase her dose unless she discusses this with [place] first. Elizabeth Brooks agrees to follow up with our clinic in 2 weeks.  Pre-Diabetes Elizabeth Brooks will continue to work on weight loss, exercise, and decreasing simple carbohydrates in her diet to help decrease the risk of diabetes. We dicussed metformin including benefits and risks. She was informed that eating too many simple carbohydrates or too many calories at one sitting increases the likelihood of GI side effects. Elizabeth Brooks agrees to start metformin 500 mg PO q PM #30 with no refills. Elizabeth Brooks agrees to follow up with our clinic in 2 weeks as directed to monitor her progress.  Diabetes risk counselling Elizabeth Brooks was given extended (30 minutes) diabetes prevention counseling today. She is 58 y.o. female and has risk factors for diabetes including obesity and pre-diabetes. We discussed intensive lifestyle modifications today with an emphasis on weight loss as well as increasing exercise and decreasing simple carbohydrates in her diet.  Obesity Elizabeth Brooks is currently in the action stage of change. As such, her goal is to continue with weight loss efforts She has agreed to follow the Category 2 plan + 100 calories Elizabeth Brooks has been instructed to work up to a goal of 150 minutes of combined cardio and  strengthening exercise per week for weight loss and overall health benefits. We discussed the following Behavioral Modification Strategies today: increasing lean protein intake, increasing vegetables and work on meal planning and easy cooking plans, and planning for success   Le has agreed to follow up with our clinic in 2 weeks. She was informed of the importance of frequent follow up visits to maximize her success with intensive  lifestyle modifications for her multiple health conditions.  ALLERGIES: Allergies  Allergen Reactions  . Penicillins Anaphylaxis and Rash    MEDICATIONS: Current Outpatient Medications on File Prior to Visit  Medication Sig Dispense Refill  . caffeine 200 MG TABS tablet Take 200 mg by mouth every 4 (four) hours as needed.    . celecoxib (CELEBREX) 200 MG capsule Take 1 capsule (200 mg total) by mouth 2 (two) times daily. 60 capsule 3  . linaclotide (LINZESS) 290 MCG CAPS capsule Take 1 capsule (290 mcg total) by mouth daily before breakfast. 30 capsule 6  . omeprazole (PRILOSEC) 20 MG capsule TAKE 1 CAPSULE (20 MG TOTAL) BY MOUTH 2 (TWO) TIMES DAILY BEFORE A MEAL. 180 capsule 3   No current facility-administered medications on file prior to visit.     PAST MEDICAL HISTORY: Past Medical History:  Diagnosis Date  . Anxiety   . Fibroid   . Gastroesophageal reflux   . GERD (gastroesophageal reflux disease)   . Hyperlipidemia   . Hypertension   . IBS (irritable bowel syndrome)   . Joint pain   . Osteoarthritis    both knees  . Prediabetes   . Prediabetes     PAST SURGICAL HISTORY: Past Surgical History:  Procedure Laterality Date  . CESAREAN SECTION     x 2  . EYE SURGERY Right   . KNEE ARTHROSCOPY Right   . KNEE SURGERY Right    removal of large cyst-non-cancerous  . TONSILLECTOMY    . UMBILICAL HERNIA REPAIR    . WISDOM TOOTH EXTRACTION      SOCIAL HISTORY: Social History   Tobacco Use  . Smoking status: Former Smoker    Packs/day: 1.00    Years: 26.00    Pack years: 26.00    Types: Cigarettes  . Smokeless tobacco: Never Used  Substance Use Topics  . Alcohol use: No  . Drug use: No    FAMILY HISTORY: Family History  Problem Relation Age of Onset  . Hyperlipidemia Mother   . Hypertension Mother   . Diabetes Mother   . Stroke Sister   . Heart disease Maternal Grandmother   . Colon cancer Neg Hx     ROS: Review of Systems  Constitutional:  Positive for malaise/fatigue and weight loss.  Gastrointestinal: Negative for nausea and vomiting.  Genitourinary: Negative for frequency.  Musculoskeletal:       Negative muscle weakness  Endo/Heme/Allergies: Negative for polydipsia.       Negative hypoglycemia    PHYSICAL EXAM: Blood pressure 133/80, pulse 60, temperature 97.8 F (36.6 C), temperature source Oral, height 5\' 4"  (1.626 m), weight 183 lb (83 kg), last menstrual period 10/07/2014, SpO2 98 %. Body mass index is 31.41 kg/m. Physical Exam Vitals signs reviewed.  Constitutional:      Appearance: Normal appearance. She is obese.  Cardiovascular:     Rate and Rhythm: Normal rate.     Pulses: Normal pulses.  Pulmonary:     Effort: Pulmonary effort is normal.     Breath sounds: Normal breath sounds.  Musculoskeletal: Normal range of  motion.  Skin:    General: Skin is warm and dry.  Neurological:     Mental Status: She is alert and oriented to person, place, and time.  Psychiatric:        Mood and Affect: Mood normal.        Behavior: Behavior normal.     RECENT LABS AND TESTS: BMET    Component Value Date/Time   NA 140 02/16/2018 1030   K 4.5 02/16/2018 1030   CL 102 02/16/2018 1030   CO2 21 02/16/2018 1030   GLUCOSE 99 02/16/2018 1030   GLUCOSE 99 10/08/2016 0939   BUN 14 02/16/2018 1030   CREATININE 0.79 02/16/2018 1030   CALCIUM 9.0 02/16/2018 1030   GFRNONAA 84 02/16/2018 1030   GFRAA 97 02/16/2018 1030   Lab Results  Component Value Date   HGBA1C 5.9 (H) 02/16/2018   HGBA1C 6.0 10/08/2016   HGBA1C 5.9 10/05/2015   Lab Results  Component Value Date   INSULIN 8.3 02/16/2018   CBC    Component Value Date/Time   WBC 6.5 02/16/2018 1030   WBC 7.7 10/08/2016 0939   RBC 4.64 02/16/2018 1030   RBC 4.71 10/08/2016 0939   HGB 12.8 02/16/2018 1030   HCT 38.1 02/16/2018 1030   PLT 311.0 10/08/2016 0939   MCV 82 02/16/2018 1030   MCH 27.6 02/16/2018 1030   MCHC 33.6 02/16/2018 1030   MCHC 32.3  10/08/2016 0939   RDW 14.9 02/16/2018 1030   LYMPHSABS 2.4 02/16/2018 1030   EOSABS 0.2 02/16/2018 1030   BASOSABS 0.0 02/16/2018 1030   Iron/TIBC/Ferritin/ %Sat No results found for: IRON, TIBC, FERRITIN, IRONPCTSAT Lipid Panel     Component Value Date/Time   CHOL 240 (H) 02/16/2018 1030   TRIG 92 02/16/2018 1030   HDL 61 02/16/2018 1030   CHOLHDL 5 10/08/2016 0939   VLDL 23.2 10/08/2016 0939   LDLCALC 161 (H) 02/16/2018 1030   Hepatic Function Panel     Component Value Date/Time   PROT 7.1 02/16/2018 1030   ALBUMIN 4.4 02/16/2018 1030   AST 29 02/16/2018 1030   ALT 23 02/16/2018 1030   ALKPHOS 75 02/16/2018 1030   BILITOT 0.3 02/16/2018 1030      Component Value Date/Time   TSH 1.950 02/16/2018 1030   TSH 1.71 10/08/2016 0939      OBESITY BEHAVIORAL INTERVENTION VISIT  Today's visit was # 2   Starting weight: 185 lbs Starting date: 02/16/2018 Today's weight : 183 lbs  Today's date: 03/02/2018 Total lbs lost to date: 2    03/02/2018  Height 5\' 4"  (1.626 m)  Weight 183 lb (83 kg)  BMI (Calculated) 31.4  BLOOD PRESSURE - SYSTOLIC 299  BLOOD PRESSURE - DIASTOLIC 80   Body Fat % 37.1 %  Total Body Water (lbs) 78.2 lbs     ASK: We discussed the diagnosis of obesity with Floreen Egley today and Mahogony agreed to give [place] permission to discuss obesity behavioral modification therapy today.  ASSESS: Mi has the diagnosis of obesity and her BMI today is 31.4 Gemini is in the action stage of change   ADVISE: Elen was educated on the multiple health risks of obesity as well as the benefit of weight loss to improve her health. She was advised of the need for long term treatment and the importance of lifestyle modifications to improve her current health and to decrease her risk of future health problems.  AGREE: Multiple dietary modification options and treatment options were  discussed and  Kassity agreed to follow the recommendations documented in the  above note.  ARRANGE: Jupiter was educated on the importance of frequent visits to treat obesity as outlined per CMS and USPSTF guidelines and agreed to schedule her next follow up appointment today.  I, Trixie Dredge, am acting as transcriptionist for Ilene Qua, MD  I have reviewed the above documentation for accuracy and completeness, and I agree with the above. - Ilene Qua, MD

## 2018-03-15 MED FILL — CELECOXIB 200 MG CAP: 200 | 30 days supply | Qty: 60 | Fill #3

## 2018-03-18 ENCOUNTER — Encounter (INDEPENDENT_AMBULATORY_CARE_PROVIDER_SITE_OTHER): Payer: Self-pay | Admitting: Family Medicine

## 2018-03-18 ENCOUNTER — Ambulatory Visit (INDEPENDENT_AMBULATORY_CARE_PROVIDER_SITE_OTHER): Payer: 59 | Admitting: Family Medicine

## 2018-03-18 ENCOUNTER — Other Ambulatory Visit: Payer: Self-pay

## 2018-03-18 VITALS — BP 121/78 | HR 59 | Temp 98.0°F | Ht 64.0 in | Wt 183.0 lb

## 2018-03-18 DIAGNOSIS — R7303 Prediabetes: Secondary | ICD-10-CM

## 2018-03-18 DIAGNOSIS — E559 Vitamin D deficiency, unspecified: Secondary | ICD-10-CM

## 2018-03-18 DIAGNOSIS — Z9189 Other specified personal risk factors, not elsewhere classified: Secondary | ICD-10-CM

## 2018-03-18 DIAGNOSIS — E669 Obesity, unspecified: Secondary | ICD-10-CM | POA: Diagnosis not present

## 2018-03-18 DIAGNOSIS — Z6831 Body mass index (BMI) 31.0-31.9, adult: Secondary | ICD-10-CM

## 2018-03-18 MED ORDER — VITAMIN D (ERGOCALCIFEROL) 1.25 MG (50000 UNIT) PO CAPS: 50000.0000 [IU] | ORAL_CAPSULE | ORAL | 0 refills | Status: DC

## 2018-03-18 NOTE — Progress Notes (Signed)
Office: [phone]  /  Fax: [phone]   HPI:   Chief Complaint: OBESITY Elizabeth Brooks is here to discuss her progress with her obesity treatment plan. She is on the Category 2 plan + 100 calories and is following her eating plan approximately 70 % of the time. She states she is doing cardio for 60 minutes 4 times per week. Elizabeth Brooks is starting to increase her exercise and is able to tolerate more activity. She is still struggling over the weekends to pick and choose the best options. She doesn't feel she needs nutrition requirements of meals. She has been very busy and often doesn't feel hungry. She is feeling like she favors Keto type diet.  Her weight is 183 lb (83 kg) today and has not lost weight since her last visit. She has lost 2 lbs since starting treatment with [place].  Pre-Diabetes Elizabeth Brooks has a diagnosis of pre-diabetes based on her elevated Hgb A1c and was informed this puts her at greater risk of developing diabetes. She notes carbohydrate cravings and denies GI side effects of metformin. She continues to work on diet and exercise to decrease risk of diabetes. She denies hypoglycemia.  Vitamin D Deficiency Elizabeth Brooks has a diagnosis of vitamin D deficiency. She is currently taking prescription Vit D. She notes fatigue and denies nausea, vomiting or muscle weakness.  At risk for osteopenia and osteoporosis Elizabeth Brooks is at higher risk of osteopenia and osteoporosis due to vitamin D deficiency.   ASSESSMENT AND PLAN:  Prediabetes  Vitamin D deficiency - Plan: Vitamin D, Ergocalciferol, (DRISDOL) 1.25 MG (50000 UT) CAPS capsule  At risk for osteoporosis  Class 1 obesity with serious comorbidity and body mass index (BMI) of 31.0 to 31.9 in adult, unspecified obesity type  PLAN:  Pre-Diabetes Elizabeth Brooks will continue to work on weight loss, exercise, and decreasing simple carbohydrates in her diet to help decrease the risk of diabetes. We dicussed metformin including benefits and risks.  She was informed that eating too many simple carbohydrates or too many calories at one sitting increases the likelihood of GI side effects. Elizabeth Brooks agrees to continue taking metformin, and she agrees to follow up with our clinic in 2 to 3 weeks as directed to monitor her progress.  Vitamin D Deficiency Elizabeth Brooks was informed that low vitamin D levels contributes to fatigue and are associated with obesity, breast, and colon cancer. Elizabeth Brooks agrees to continue taking prescription Vit D @50 ,000 IU every week #4 and we will refill for 1 month. She will follow up for routine testing of vitamin D, at least 2-3 times per year. She was informed of the risk of over-replacement of vitamin D and agrees to not increase her dose unless she discusses this with [place] first. Mikaiya agrees to follow up with our clinic in 2 to 3 weeks.  At risk for osteopenia and osteoporosis Elizabeth Brooks was given extended (15 minutes) osteoporosis prevention counseling today. Elizabeth Brooks is at risk for osteopenia and osteoporsis due to her vitamin D deficiency. She was encouraged to take her vitamin D and follow her higher calcium diet and increase strengthening exercise to help strengthen her bones and decrease her risk of osteopenia and osteoporosis.  Obesity Elizabeth Brooks is currently in the action stage of change. As such, her goal is to continue with weight loss efforts She has agreed to follow a lower carbohydrate, vegetable and lean protein rich diet plan Elizabeth Brooks has been instructed to work up to a goal of 150 minutes of combined cardio and strengthening exercise per  week for weight loss and overall health benefits. We discussed the following Behavioral Modification Strategies today: increasing lean protein intake, increasing vegetables and work on meal planning and easy cooking plans, and planning for success   Elizabeth Brooks has agreed to follow up with our clinic in 2 to 3 weeks. She was informed of the importance of frequent follow up visits to  maximize her success with intensive lifestyle modifications for her multiple health conditions.  ALLERGIES: Allergies  Allergen Reactions  . Penicillins Anaphylaxis and Rash    MEDICATIONS: Current Outpatient Medications on File Prior to Visit  Medication Sig Dispense Refill  . caffeine 200 MG TABS tablet Take 200 mg by mouth every 4 (four) hours as needed.    . celecoxib (CELEBREX) 200 MG capsule Take 1 capsule (200 mg total) by mouth 2 (two) times daily. 60 capsule 3  . linaclotide (LINZESS) 290 MCG CAPS capsule Take 1 capsule (290 mcg total) by mouth daily before breakfast. 30 capsule 6  . metFORMIN (GLUCOPHAGE) 500 MG tablet Take 1 tablet (500 mg total) by mouth daily with breakfast. 30 tablet 0  . omeprazole (PRILOSEC) 20 MG capsule TAKE 1 CAPSULE (20 MG TOTAL) BY MOUTH 2 (TWO) TIMES DAILY BEFORE A MEAL. 180 capsule 3   No current facility-administered medications on file prior to visit.     PAST MEDICAL HISTORY: Past Medical History:  Diagnosis Date  . Anxiety   . Fibroid   . Gastroesophageal reflux   . GERD (gastroesophageal reflux disease)   . Hyperlipidemia   . Hypertension   . IBS (irritable bowel syndrome)   . Joint pain   . Osteoarthritis    both knees  . Prediabetes   . Prediabetes     PAST SURGICAL HISTORY: Past Surgical History:  Procedure Laterality Date  . CESAREAN SECTION     x 2  . EYE SURGERY Right   . KNEE ARTHROSCOPY Right   . KNEE SURGERY Right    removal of large cyst-non-cancerous  . TONSILLECTOMY    . UMBILICAL HERNIA REPAIR    . WISDOM TOOTH EXTRACTION      SOCIAL HISTORY: Social History   Tobacco Use  . Smoking status: Former Smoker    Packs/day: 1.00    Years: 26.00    Pack years: 26.00    Types: Cigarettes  . Smokeless tobacco: Never Used  Substance Use Topics  . Alcohol use: No  . Drug use: No    FAMILY HISTORY: Family History  Problem Relation Age of Onset  . Hyperlipidemia Mother   . Hypertension Mother   .  Diabetes Mother   . Stroke Sister   . Heart disease Maternal Grandmother   . Colon cancer Neg Hx     ROS: Review of Systems  Constitutional: Positive for malaise/fatigue. Negative for weight loss.  Gastrointestinal: Negative for nausea and vomiting.  Musculoskeletal:       Negative muscle weakness  Endo/Heme/Allergies:       Negative hypoglycemia    PHYSICAL EXAM: Blood pressure 121/78, pulse (!) 59, temperature 98 F (36.7 C), temperature source Oral, height 5\' 4"  (1.626 m), weight 183 lb (83 kg), last menstrual period 10/07/2014, SpO2 100 %. Body mass index is 31.41 kg/m. Physical Exam Vitals signs reviewed.  Constitutional:      Appearance: Normal appearance. She is obese.  Cardiovascular:     Rate and Rhythm: Normal rate.     Pulses: Normal pulses.  Pulmonary:     Effort: Pulmonary effort is normal.  Breath sounds: Normal breath sounds.  Musculoskeletal: Normal range of motion.  Skin:    General: Skin is warm and dry.  Neurological:     Mental Status: She is alert and oriented to person, place, and time.  Psychiatric:        Mood and Affect: Mood normal.        Behavior: Behavior normal.     RECENT LABS AND TESTS: BMET    Component Value Date/Time   NA 140 02/16/2018 1030   K 4.5 02/16/2018 1030   CL 102 02/16/2018 1030   CO2 21 02/16/2018 1030   GLUCOSE 99 02/16/2018 1030   GLUCOSE 99 10/08/2016 0939   BUN 14 02/16/2018 1030   CREATININE 0.79 02/16/2018 1030   CALCIUM 9.0 02/16/2018 1030   GFRNONAA 84 02/16/2018 1030   GFRAA 97 02/16/2018 1030   Lab Results  Component Value Date   HGBA1C 5.9 (H) 02/16/2018   HGBA1C 6.0 10/08/2016   HGBA1C 5.9 10/05/2015   Lab Results  Component Value Date   INSULIN 8.3 02/16/2018   CBC    Component Value Date/Time   WBC 6.5 02/16/2018 1030   WBC 7.7 10/08/2016 0939   RBC 4.64 02/16/2018 1030   RBC 4.71 10/08/2016 0939   HGB 12.8 02/16/2018 1030   HCT 38.1 02/16/2018 1030   PLT 311.0 10/08/2016 0939    MCV 82 02/16/2018 1030   MCH 27.6 02/16/2018 1030   MCHC 33.6 02/16/2018 1030   MCHC 32.3 10/08/2016 0939   RDW 14.9 02/16/2018 1030   LYMPHSABS 2.4 02/16/2018 1030   EOSABS 0.2 02/16/2018 1030   BASOSABS 0.0 02/16/2018 1030   Iron/TIBC/Ferritin/ %Sat No results found for: IRON, TIBC, FERRITIN, IRONPCTSAT Lipid Panel     Component Value Date/Time   CHOL 240 (H) 02/16/2018 1030   TRIG 92 02/16/2018 1030   HDL 61 02/16/2018 1030   CHOLHDL 5 10/08/2016 0939   VLDL 23.2 10/08/2016 0939   LDLCALC 161 (H) 02/16/2018 1030   Hepatic Function Panel     Component Value Date/Time   PROT 7.1 02/16/2018 1030   ALBUMIN 4.4 02/16/2018 1030   AST 29 02/16/2018 1030   ALT 23 02/16/2018 1030   ALKPHOS 75 02/16/2018 1030   BILITOT 0.3 02/16/2018 1030      Component Value Date/Time   TSH 1.950 02/16/2018 1030   TSH 1.71 10/08/2016 0939      OBESITY BEHAVIORAL INTERVENTION VISIT  Today's visit was # 3   Starting weight: 185 lbs Starting date: 02/16/2018 Today's weight : 183 lbs  Today's date: 03/18/2018 Total lbs lost to date: 2    03/18/2018  Height 5\' 4"  (1.626 m)  Weight 183 lb (83 kg)  BMI (Calculated) 31.4  BLOOD PRESSURE - SYSTOLIC 379  BLOOD PRESSURE - DIASTOLIC 78   Body Fat % 02.4 %  Total Body Water (lbs) 77.2 lbs     ASK: We discussed the diagnosis of obesity with Elizabeth Brooks today and Elizabeth Brooks agreed to give [place] permission to discuss obesity behavioral modification therapy today.  ASSESS: Elizabeth Brooks has the diagnosis of obesity and her BMI today is 31.4 Elizabeth Brooks is in the action stage of change   ADVISE: Elizabeth Brooks was educated on the multiple health risks of obesity as well as the benefit of weight loss to improve her health. She was advised of the need for long term treatment and the importance of lifestyle modifications to improve her current health and to decrease her risk of future health problems.  AGREE: Multiple dietary modification options and treatment  options were discussed and  Elizabeth Brooks agreed to follow the recommendations documented in the above note.  ARRANGE: Elizabeth Brooks was educated on the importance of frequent visits to treat obesity as outlined per CMS and USPSTF guidelines and agreed to schedule her next follow up appointment today.  I, Trixie Dredge, am acting as transcriptionist for Ilene Qua, MD  I have reviewed the above documentation for accuracy and completeness, and I agree with the above. - Ilene Qua, MD

## 2018-03-27 ENCOUNTER — Other Ambulatory Visit: Payer: Self-pay | Admitting: Gastroenterology

## 2018-03-27 ENCOUNTER — Encounter (INDEPENDENT_AMBULATORY_CARE_PROVIDER_SITE_OTHER): Payer: Self-pay | Admitting: Physician Assistant

## 2018-03-27 MED FILL — LINZESS 290 MCG CAPSULE: 290 | 90 days supply | Qty: 90 | Fill #2

## 2018-03-29 ENCOUNTER — Other Ambulatory Visit (INDEPENDENT_AMBULATORY_CARE_PROVIDER_SITE_OTHER): Payer: Self-pay

## 2018-03-29 DIAGNOSIS — R7303 Prediabetes: Secondary | ICD-10-CM

## 2018-03-29 MED ORDER — METFORMIN HCL 500 MG PO TABS: 500.0000 mg | ORAL_TABLET | Freq: Every day | ORAL | 0 refills | Status: DC

## 2018-03-31 ENCOUNTER — Encounter (INDEPENDENT_AMBULATORY_CARE_PROVIDER_SITE_OTHER): Payer: Self-pay

## 2018-04-06 ENCOUNTER — Ambulatory Visit (INDEPENDENT_AMBULATORY_CARE_PROVIDER_SITE_OTHER): Payer: 59 | Admitting: Physician Assistant

## 2018-04-06 ENCOUNTER — Other Ambulatory Visit: Payer: Self-pay

## 2018-04-06 ENCOUNTER — Encounter (INDEPENDENT_AMBULATORY_CARE_PROVIDER_SITE_OTHER): Payer: Self-pay | Admitting: Physician Assistant

## 2018-04-06 DIAGNOSIS — Z6831 Body mass index (BMI) 31.0-31.9, adult: Secondary | ICD-10-CM | POA: Diagnosis not present

## 2018-04-06 DIAGNOSIS — E669 Obesity, unspecified: Secondary | ICD-10-CM | POA: Diagnosis not present

## 2018-04-06 DIAGNOSIS — R7303 Prediabetes: Secondary | ICD-10-CM | POA: Diagnosis not present

## 2018-04-07 NOTE — Progress Notes (Signed)
Office: [phone]  /  Fax: [phone] TeleHealth Visit:  Elizabeth Brooks has consented to this TeleHealth visit today via telephone call. The patient is located at home, the provider is located at the [employer] [facility] office. The participants in this visit include the listed provider and patient. Time spent on visit was 25 minutes  HPI:   Chief Complaint: OBESITY Elizabeth Brooks is here to discuss her progress with her obesity treatment plan. She is on the lower carbohydrate, vegetable and lean protein rich diet plan and is following her eating plan approximately 90 % of the time. She states she is walking and jogging for 60 minutes 7 times per week. Elizabeth Brooks reports that she enjoys the low carbohydrate plan. She is exercising more, walking and sometimes jogging. She is not eating enough protein to support her workouts.  We were unable to weight the patient today for this TeleHealth visit. She feels as if she has lost 3 lbs since her last visit. She has lost 2-5 lbs since starting treatment with [place].  Pre-Diabetes Elizabeth Brooks has a diagnosis of pre-diabetes based on her elevated Hgb A1c and was informed this puts her at greater risk of developing diabetes. She is on metformin and denies nausea, vomiting, or diarrhea. She continues to work on diet and exercise to decrease risk of diabetes. She denies polyphagia or hypoglycemia.  ASSESSMENT AND PLAN:  Prediabetes  Class 1 obesity with serious comorbidity and body mass index (BMI) of 31.0 to 31.9 in adult, unspecified obesity type  PLAN:  Pre-Diabetes Elizabeth Brooks will continue to work on weight loss, exercise, and decreasing simple carbohydrates in her diet to help decrease the risk of diabetes. We dicussed metformin including benefits and risks. She was informed that eating too many simple carbohydrates or too many calories at one sitting increases the likelihood of GI side effects. Elizabeth Brooks agrees to continue taking metformin, and she agrees to  follow up with our clinic in 3 weeks as directed to monitor her progress.  I spent > than 50% of the 25 minute visit on counseling as documented in the note.  Obesity Elizabeth Brooks is currently in the action stage of change. As such, her goal is to continue with weight loss efforts She has agreed to follow a lower carbohydrate, vegetable and lean protein rich diet plan Elizabeth Brooks has been instructed to work up to a goal of 150 minutes of combined cardio and strengthening exercise per week for weight loss and overall health benefits. We discussed the following Behavioral Modification Strategies today: increasing lean protein intake and work on meal planning and easy cooking plans   Elizabeth Brooks has agreed to follow up with our clinic in 3 weeks. She was informed of the importance of frequent follow up visits to maximize her success with intensive lifestyle modifications for her multiple health conditions.  ALLERGIES: Allergies  Allergen Reactions  . Penicillins Anaphylaxis and Rash    MEDICATIONS: Current Outpatient Medications on File Prior to Visit  Medication Sig Dispense Refill  . caffeine 200 MG TABS tablet Take 200 mg by mouth every 4 (four) hours as needed.    . celecoxib (CELEBREX) 200 MG capsule Take 1 capsule (200 mg total) by mouth 2 (two) times daily. 60 capsule 3  . linaclotide (LINZESS) 290 MCG CAPS capsule Take 1 capsule (290 mcg total) by mouth daily before breakfast. 30 capsule 6  . metFORMIN (GLUCOPHAGE) 500 MG tablet Take 1 tablet (500 mg total) by mouth daily with breakfast. 30 tablet 0  .  omeprazole (PRILOSEC) 20 MG capsule TAKE 1 CAPSULE (20 MG TOTAL) BY MOUTH TWICE A DAY BEFORE A MEAL. 180 capsule 3  . Vitamin D, Ergocalciferol, (DRISDOL) 1.25 MG (50000 UT) CAPS capsule Take 1 capsule (50,000 Units total) by mouth every 7 (seven) days. 4 capsule 0   No current facility-administered medications on file prior to visit.     PAST MEDICAL HISTORY: Past Medical History:   Diagnosis Date  . Anxiety   . Fibroid   . Gastroesophageal reflux   . GERD (gastroesophageal reflux disease)   . Hyperlipidemia   . Hypertension   . IBS (irritable bowel syndrome)   . Joint pain   . Osteoarthritis    both knees  . Prediabetes   . Prediabetes     PAST SURGICAL HISTORY: Past Surgical History:  Procedure Laterality Date  . CESAREAN SECTION     x 2  . EYE SURGERY Right   . KNEE ARTHROSCOPY Right   . KNEE SURGERY Right    removal of large cyst-non-cancerous  . TONSILLECTOMY    . UMBILICAL HERNIA REPAIR    . WISDOM TOOTH EXTRACTION      SOCIAL HISTORY: Social History   Tobacco Use  . Smoking status: Former Smoker    Packs/day: 1.00    Years: 26.00    Pack years: 26.00    Types: Cigarettes  . Smokeless tobacco: Never Used  Substance Use Topics  . Alcohol use: No  . Drug use: No    FAMILY HISTORY: Family History  Problem Relation Age of Onset  . Hyperlipidemia Mother   . Hypertension Mother   . Diabetes Mother   . Stroke Sister   . Heart disease Maternal Grandmother   . Colon cancer Neg Hx     ROS: Review of Systems  Constitutional: Positive for weight loss.  Gastrointestinal: Negative for diarrhea, nausea and vomiting.  Endo/Heme/Allergies:       Negative polyphagia Negative hypoglycemia    PHYSICAL EXAM: Pt in no acute distress  RECENT LABS AND TESTS: BMET    Component Value Date/Time   NA 140 02/16/2018 1030   K 4.5 02/16/2018 1030   CL 102 02/16/2018 1030   CO2 21 02/16/2018 1030   GLUCOSE 99 02/16/2018 1030   GLUCOSE 99 10/08/2016 0939   BUN 14 02/16/2018 1030   CREATININE 0.79 02/16/2018 1030   CALCIUM 9.0 02/16/2018 1030   GFRNONAA 84 02/16/2018 1030   GFRAA 97 02/16/2018 1030   Lab Results  Component Value Date   HGBA1C 5.9 (H) 02/16/2018   HGBA1C 6.0 10/08/2016   HGBA1C 5.9 10/05/2015   Lab Results  Component Value Date   INSULIN 8.3 02/16/2018   CBC    Component Value Date/Time   WBC 6.5 02/16/2018  1030   WBC 7.7 10/08/2016 0939   RBC 4.64 02/16/2018 1030   RBC 4.71 10/08/2016 0939   HGB 12.8 02/16/2018 1030   HCT 38.1 02/16/2018 1030   PLT 311.0 10/08/2016 0939   MCV 82 02/16/2018 1030   MCH 27.6 02/16/2018 1030   MCHC 33.6 02/16/2018 1030   MCHC 32.3 10/08/2016 0939   RDW 14.9 02/16/2018 1030   LYMPHSABS 2.4 02/16/2018 1030   EOSABS 0.2 02/16/2018 1030   BASOSABS 0.0 02/16/2018 1030   Iron/TIBC/Ferritin/ %Sat No results found for: IRON, TIBC, FERRITIN, IRONPCTSAT Lipid Panel     Component Value Date/Time   CHOL 240 (H) 02/16/2018 1030   TRIG 92 02/16/2018 1030   HDL 61 02/16/2018 1030  CHOLHDL 5 10/08/2016 0939   VLDL 23.2 10/08/2016 0939   LDLCALC 161 (H) 02/16/2018 1030   Hepatic Function Panel     Component Value Date/Time   PROT 7.1 02/16/2018 1030   ALBUMIN 4.4 02/16/2018 1030   AST 29 02/16/2018 1030   ALT 23 02/16/2018 1030   ALKPHOS 75 02/16/2018 1030   BILITOT 0.3 02/16/2018 1030      Component Value Date/Time   TSH 1.950 02/16/2018 1030   TSH 1.71 10/08/2016 0939      I, Trixie Dredge, am acting as transcriptionist for Abby Potash, PA-C I, Abby Potash, PA-C have reviewed above note and agree with its content

## 2018-04-09 MED FILL — metFORMIN HCL 500 MG TABS: 500 | 30 days supply | Qty: 30 | Fill #0

## 2018-04-23 ENCOUNTER — Encounter (INDEPENDENT_AMBULATORY_CARE_PROVIDER_SITE_OTHER): Payer: Self-pay | Admitting: Physician Assistant

## 2018-04-23 MED FILL — OMEPRAZOLE 20 MG CPDR: 20 | 90 days supply | Qty: 180 | Fill #0

## 2018-04-26 ENCOUNTER — Telehealth: Payer: Self-pay | Admitting: Nurse Practitioner

## 2018-04-26 NOTE — Telephone Encounter (Signed)
I called and left message on patient voicemail to call office per [facility] to schedule CPE.

## 2018-04-27 ENCOUNTER — Ambulatory Visit (INDEPENDENT_AMBULATORY_CARE_PROVIDER_SITE_OTHER): Payer: 59 | Admitting: Physician Assistant

## 2018-05-17 ENCOUNTER — Encounter: Payer: Self-pay | Admitting: Orthopaedic Surgery

## 2018-05-17 ENCOUNTER — Other Ambulatory Visit: Payer: Self-pay

## 2018-05-17 MED ORDER — CELECOXIB 200 MG PO CAPS: 200.0000 mg | ORAL_CAPSULE | Freq: Two times a day (BID) | ORAL | 0 refills | Status: DC

## 2018-05-17 MED FILL — CELECOXIB 200 MG CAPSULE: 200 | 90 days supply | Qty: 180 | Fill #0

## 2018-05-17 NOTE — Telephone Encounter (Signed)
Yes refill please

## 2018-08-16 ENCOUNTER — Telehealth (INDEPENDENT_AMBULATORY_CARE_PROVIDER_SITE_OTHER): Payer: 59 | Admitting: Physician Assistant

## 2018-08-16 ENCOUNTER — Encounter (INDEPENDENT_AMBULATORY_CARE_PROVIDER_SITE_OTHER): Payer: Self-pay | Admitting: Physician Assistant

## 2018-08-16 ENCOUNTER — Other Ambulatory Visit: Payer: Self-pay

## 2018-08-16 DIAGNOSIS — E559 Vitamin D deficiency, unspecified: Secondary | ICD-10-CM

## 2018-08-16 DIAGNOSIS — G4700 Insomnia, unspecified: Secondary | ICD-10-CM | POA: Diagnosis not present

## 2018-08-16 DIAGNOSIS — E669 Obesity, unspecified: Secondary | ICD-10-CM

## 2018-08-16 DIAGNOSIS — Z6831 Body mass index (BMI) 31.0-31.9, adult: Secondary | ICD-10-CM | POA: Diagnosis not present

## 2018-08-16 MED ORDER — VITAMIN D (ERGOCALCIFEROL) 1.25 MG (50000 UNIT) PO CAPS: 50000.0000 [IU] | ORAL_CAPSULE | ORAL | 0 refills | Status: DC

## 2018-08-17 NOTE — Progress Notes (Signed)
Office: [phone]  /  Fax: [phone] TeleHealth Visit:  Elizabeth Brooks Brooks has verbally consented to this TeleHealth visit today. The patient is located at home, the provider is located at the [employer] [facility] office. The participants in this visit include the listed provider and patient. The visit was conducted today via Webex.  HPI:   Chief Complaint: OBESITY Elizabeth Brooks is here to discuss her progress with her obesity treatment plan. She is on a lower carbohydrate, vegetable and lean protein rich diet plan and is following her eating plan approximately 70% of the time. She states she is walking/elliptical/biking 60 minutes 4 times per week. Elizabeth Brooks reports her most recent weight to be 187 lbs. She reports not getting in enough calories or protein during the day. We were unable to weigh the patient today for this TeleHealth visit. She feels as if she has gained 3 lbs since her last visit. She has lost 2 lbs since starting treatment with [place].  Vitamin D deficiency Elizabeth Brooks has a diagnosis of Vitamin D deficiency. She is currently taking prescription Vit D and denies nausea, vomiting or muscle weakness.  Insomnia Elizabeth Brooks complains of intermittent insomnia due to stress.  ASSESSMENT AND PLAN:  Vitamin D deficiency - Plan: Vitamin D, Ergocalciferol, (DRISDOL) 1.25 MG (50000 UT) CAPS capsule  Insomnia, unspecified type  Class 1 obesity with serious comorbidity and body mass index (BMI) of 31.0 to 31.9 in adult, unspecified obesity type  PLAN:  Vitamin D Deficiency Elizabeth Brooks was informed that low Vitamin D levels contributes to fatigue and are associated with obesity, breast, and colon cancer. She agrees to continue to take prescription Vit D @ 50,000 IU every week and will follow-up for routine testing of Vitamin D, at least 2-3 times per year. She was informed of the risk of over-replacement of Vitamin D and agrees to not increase her dose unless she discusses this with [place] first.  Elizabeth Brooks agrees to follow-up with our clinic in 2 weeks.  Insomnia The problem of recurrent insomnia was discussed. Avoidance of caffeine sources was strongly encouraged and sleep hygiene issues were reviewed. We recommended OTC melatonin 0.5 mg QPM 3-4 hours prior to sleep.  Obesity Elizabeth Brooks is currently in the action stage of change. As such, her goal is to continue with weight loss efforts. She has agreed to keep a food journal with 1200-1300 calories and 85 grams of protein daily. Elizabeth Brooks has been instructed to work up to a goal of 150 minutes of combined cardio and strengthening exercise per week for weight loss and overall health benefits. We discussed the following Behavioral Modification Strategies today: increasing lean protein intake, work on meal planning and easy cooking plans.  Elizabeth Brooks has agreed to follow-up with our clinic in 2 weeks. She was informed of the importance of frequent follow-up visits to maximize her success with intensive lifestyle modifications for her multiple health conditions.  ALLERGIES: Allergies  Allergen Reactions   Penicillins Anaphylaxis and Rash    MEDICATIONS: Current Outpatient Medications on File Prior to Visit  Medication Sig Dispense Refill   caffeine 200 MG TABS tablet Take 200 mg by mouth every 4 (four) hours as needed.     celecoxib (CELEBREX) 200 MG capsule Take 1 capsule (200 mg total) by mouth 2 (two) times daily. 180 capsule 0   linaclotide (LINZESS) 290 MCG CAPS capsule Take 1 capsule (290 mcg total) by mouth daily before breakfast. 30 capsule 6   metFORMIN (GLUCOPHAGE) 500 MG tablet Take 1 tablet (500 mg  total) by mouth daily with breakfast. 30 tablet 0   omeprazole (PRILOSEC) 20 MG capsule TAKE 1 CAPSULE (20 MG TOTAL) BY MOUTH TWICE A DAY BEFORE A MEAL. 180 capsule 3   No current facility-administered medications on file prior to visit.     PAST MEDICAL HISTORY: Past Medical History:  Diagnosis Date   Anxiety    Fibroid     Gastroesophageal reflux    GERD (gastroesophageal reflux disease)    Hyperlipidemia    Hypertension    IBS (irritable bowel syndrome)    Joint pain    Osteoarthritis    both knees   Prediabetes    Prediabetes     PAST SURGICAL HISTORY: Past Surgical History:  Procedure Laterality Date   CESAREAN SECTION     x 2   EYE SURGERY Right    KNEE ARTHROSCOPY Right    KNEE SURGERY Right    removal of large cyst-non-cancerous   TONSILLECTOMY     UMBILICAL HERNIA REPAIR     WISDOM TOOTH EXTRACTION      SOCIAL HISTORY: Social History   Tobacco Use   Smoking status: Former Smoker    Packs/day: 1.00    Years: 26.00    Pack years: 26.00    Types: Cigarettes   Smokeless tobacco: Never Used  Substance Use Topics   Alcohol use: No   Drug use: No    FAMILY HISTORY: Family History  Problem Relation Age of Onset   Hyperlipidemia Mother    Hypertension Mother    Diabetes Mother    Stroke Sister    Heart disease Maternal Grandmother    Colon cancer Neg Hx    ROS: Review of Systems  Gastrointestinal: Negative for nausea and vomiting.  Musculoskeletal:       Negative for muscle weakness.  Psychiatric/Behavioral: The patient has insomnia (due to stress).    PHYSICAL EXAM: Pt in no acute distress  RECENT LABS AND TESTS: BMET    Component Value Date/Time   NA 140 02/16/2018 1030   K 4.5 02/16/2018 1030   CL 102 02/16/2018 1030   CO2 21 02/16/2018 1030   GLUCOSE 99 02/16/2018 1030   GLUCOSE 99 10/08/2016 0939   BUN 14 02/16/2018 1030   CREATININE 0.79 02/16/2018 1030   CALCIUM 9.0 02/16/2018 1030   GFRNONAA 84 02/16/2018 1030   GFRAA 97 02/16/2018 1030   Lab Results  Component Value Date   HGBA1C 5.9 (H) 02/16/2018   HGBA1C 6.0 10/08/2016   HGBA1C 5.9 10/05/2015   Lab Results  Component Value Date   INSULIN 8.3 02/16/2018   CBC    Component Value Date/Time   WBC 6.5 02/16/2018 1030   WBC 7.7 10/08/2016 0939   RBC 4.64  02/16/2018 1030   RBC 4.71 10/08/2016 0939   HGB 12.8 02/16/2018 1030   HCT 38.1 02/16/2018 1030   PLT 311.0 10/08/2016 0939   MCV 82 02/16/2018 1030   MCH 27.6 02/16/2018 1030   MCHC 33.6 02/16/2018 1030   MCHC 32.3 10/08/2016 0939   RDW 14.9 02/16/2018 1030   LYMPHSABS 2.4 02/16/2018 1030   EOSABS 0.2 02/16/2018 1030   BASOSABS 0.0 02/16/2018 1030   Iron/TIBC/Ferritin/ %Sat No results found for: IRON, TIBC, FERRITIN, IRONPCTSAT Lipid Panel     Component Value Date/Time   CHOL 240 (H) 02/16/2018 1030   TRIG 92 02/16/2018 1030   HDL 61 02/16/2018 1030   CHOLHDL 5 10/08/2016 0939   VLDL 23.2 10/08/2016 0939   LDLCALC 161 (  H) 02/16/2018 1030   Hepatic Function Panel     Component Value Date/Time   PROT 7.1 02/16/2018 1030   ALBUMIN 4.4 02/16/2018 1030   AST 29 02/16/2018 1030   ALT 23 02/16/2018 1030   ALKPHOS 75 02/16/2018 1030   BILITOT 0.3 02/16/2018 1030      Component Value Date/Time   TSH 1.950 02/16/2018 1030   TSH 1.71 10/08/2016 0939   Results for NICHOLLE, FALZON (MRN [id number]) as of 08/17/2018 13:50  Ref. Range 02/16/2018 10:30  Vitamin D, 25-Hydroxy Latest Ref Range: 30.0 - 100.0 ng/mL 24.6 (L)   I, Michaelene Song, am acting as Location manager for [employer], PA-C I, Abby Potash, PA-C have reviewed above note and agree with its content

## 2018-08-20 ENCOUNTER — Telehealth: Payer: Self-pay | Admitting: Gastroenterology

## 2018-08-20 MED ORDER — LINACLOTIDE 290 MCG PO CAPS: 290.0000 ug | ORAL_CAPSULE | Freq: Every day | ORAL | 3 refills | Status: DC

## 2018-08-20 MED FILL — LINZESS 290 MCG CAPSULE: 290 | 90 days supply | Qty: 90 | Fill #0

## 2018-08-20 NOTE — Telephone Encounter (Signed)
linzess 90 day sent to [facility]

## 2018-08-31 ENCOUNTER — Encounter (INDEPENDENT_AMBULATORY_CARE_PROVIDER_SITE_OTHER): Payer: Self-pay | Admitting: Physician Assistant

## 2018-08-31 ENCOUNTER — Ambulatory Visit (INDEPENDENT_AMBULATORY_CARE_PROVIDER_SITE_OTHER): Payer: 59 | Admitting: Physician Assistant

## 2018-08-31 ENCOUNTER — Other Ambulatory Visit: Payer: Self-pay

## 2018-08-31 VITALS — BP 122/79 | HR 57 | Temp 98.4°F | Ht 64.0 in | Wt 182.0 lb

## 2018-08-31 DIAGNOSIS — E559 Vitamin D deficiency, unspecified: Secondary | ICD-10-CM

## 2018-08-31 DIAGNOSIS — Z6831 Body mass index (BMI) 31.0-31.9, adult: Secondary | ICD-10-CM | POA: Diagnosis not present

## 2018-08-31 DIAGNOSIS — E669 Obesity, unspecified: Secondary | ICD-10-CM | POA: Diagnosis not present

## 2018-08-31 DIAGNOSIS — R7303 Prediabetes: Secondary | ICD-10-CM | POA: Diagnosis not present

## 2018-08-31 DIAGNOSIS — Z9189 Other specified personal risk factors, not elsewhere classified: Secondary | ICD-10-CM | POA: Diagnosis not present

## 2018-08-31 DIAGNOSIS — E7849 Other hyperlipidemia: Secondary | ICD-10-CM

## 2018-08-31 MED ORDER — VITAMIN D (ERGOCALCIFEROL) 1.25 MG (50000 UNIT) PO CAPS: 50000.0000 [IU] | ORAL_CAPSULE | ORAL | 0 refills | Status: DC

## 2018-08-31 MED FILL — VIT D2 1.25 MG (50,000 UNIT: 1.25 MG | 28 days supply | Qty: 4 | Fill #0

## 2018-09-01 LAB — COMPREHENSIVE METABOLIC PANEL
ALT: 12 IU/L (ref 0–32)
AST: 21 IU/L (ref 0–40)
Albumin/Globulin Ratio: 1.7 (ref 1.2–2.2)
Albumin: 4.7 g/dL (ref 3.8–4.9)
Alkaline Phosphatase: 59 IU/L (ref 39–117)
BUN/Creatinine Ratio: 15 (ref 9–23)
BUN: 13 mg/dL (ref 6–24)
Bilirubin Total: 0.4 mg/dL (ref 0.0–1.2)
CO2: 23 mmol/L (ref 20–29)
Calcium: 9.4 mg/dL (ref 8.7–10.2)
Chloride: 104 mmol/L (ref 96–106)
Creatinine, Ser: 0.88 mg/dL (ref 0.57–1.00)
GFR calc Af Amer: 85 mL/min/{1.73_m2} (ref >59)
GFR calc non Af Amer: 74 mL/min/{1.73_m2} (ref >59)
Globulin, Total: 2.7 g/dL (ref 1.5–4.5)
Glucose: 93 mg/dL (ref 65–99)
Potassium: 5 mmol/L (ref 3.5–5.2)
Sodium: 141 mmol/L (ref 134–144)
Total Protein: 7.4 g/dL (ref 6.0–8.5)

## 2018-09-01 LAB — HEMOGLOBIN A1C
Est. average glucose Bld gHb Est-mCnc: 114 mg/dL
Hgb A1c MFr Bld: 5.6 % (ref 4.8–5.6)

## 2018-09-01 LAB — LIPID PANEL
Chol/HDL Ratio: 4.5 ratio — ABNORMAL HIGH (ref 0.0–4.4)
Cholesterol, Total: 251 mg/dL — ABNORMAL HIGH (ref 100–199)
HDL: 56 mg/dL (ref >39)
LDL Calculated: 176 mg/dL — ABNORMAL HIGH (ref 0–99)
Triglycerides: 97 mg/dL (ref 0–149)
VLDL Cholesterol Cal: 19 mg/dL (ref 5–40)

## 2018-09-01 LAB — INSULIN, RANDOM: INSULIN: 8.7 u[IU]/mL (ref 2.6–24.9)

## 2018-09-01 LAB — VITAMIN D 25 HYDROXY (VIT D DEFICIENCY, FRACTURES): Vit D, 25-Hydroxy: 36.1 ng/mL (ref 30.0–100.0)

## 2018-09-01 NOTE — Progress Notes (Signed)
Office: [phone]  /  Fax: [phone]   HPI:   Chief Complaint: OBESITY Elizabeth Brooks is here to discuss her progress with her obesity treatment plan. She is on the keep a food journal with 1200 to 1300 calories and 85 grams of protein daily and is following her eating plan approximately 60 % of the time. She states she is exercising 0 minutes 0 times per week. Elizabeth Brooks reports that she is not eating enough protein, although she is reaching her calorie goal daily. She is moving into a new house next week. Her weight is 182 lb (82.6 kg) today and has had a weight loss of 1 pound since her last in-office visit. She has lost 3 lbs since starting treatment with [place].  Vitamin D deficiency Elizabeth Brooks has a diagnosis of vitamin D deficiency. She is currently taking vit D and denies nausea, vomiting or muscle weakness.  At risk for osteopenia and osteoporosis Elizabeth Brooks is at higher risk of osteopenia and osteoporosis due to vitamin D deficiency.   Hyperlipidemia Elizabeth Brooks has hyperlipidemia and she is not on medications. She has been trying to improve her cholesterol levels with intensive lifestyle modification including a low saturated fat diet, exercise and weight loss. She denies any chest pain.  Pre-Diabetes Elizabeth Brooks has a diagnosis of prediabetes based on her elevated Hgb A1c and was informed this puts her at greater risk of developing diabetes. She is taking metformin currently and continues to work on diet and exercise to decrease risk of diabetes. She denies nausea, vomiting or diarrhea.  ASSESSMENT AND PLAN:  Vitamin D deficiency - Plan: Vitamin D, Ergocalciferol, (DRISDOL) 1.25 MG (50000 UT) CAPS capsule, Vitamin D 1,25 dihydroxy  Other hyperlipidemia - Plan: Lipid panel  Prediabetes - Plan: Comprehensive metabolic panel, Hemoglobin A1c, Insulin, random  At risk for osteoporosis  Class 1 obesity with serious comorbidity and body mass index (BMI) of 31.0 to 31.9 in adult, unspecified  obesity type  PLAN:  Vitamin D Deficiency Elizabeth Brooks was informed that low vitamin D levels contributes to fatigue and are associated with obesity, breast, and colon cancer. She agrees to continue to take prescription Vit D @50 ,000 IU every week #4 with no refills and will follow up for routine testing of vitamin D, at least 2-3 times per year. She was informed of the risk of over-replacement of vitamin D and agrees to not increase her dose unless she discusses this with [place] first. We will check labs (send to [place] agrees to follow up as directed.  At risk for osteopenia and osteoporosis Elizabeth Brooks was given extended  (15 minutes) osteoporosis prevention counseling today. Elizabeth Brooks is at risk for osteopenia and osteoporosis due to her vitamin D deficiency. She was encouraged to take her vitamin D and follow her higher calcium diet and increase strengthening exercise to help strengthen her bones and decrease her risk of osteopenia and osteoporosis.  Hyperlipidemia Elizabeth Brooks was informed of the American Heart Association Guidelines emphasizing intensive lifestyle modifications as the first line treatment for hyperlipidemia. We discussed many lifestyle modifications today in depth, and Elizabeth Brooks will continue to work on decreasing saturated fats such as fatty red meat, butter and many fried foods. She will also increase vegetables and lean protein in her diet and continue to work on exercise and weight loss efforts. We will check labs and Elizabeth Brooks will follow up at the agreed upon time.  Pre-Diabetes Elizabeth Brooks will continue to work on weight loss, exercise, and decreasing simple carbohydrates in her  diet to help decrease the risk of diabetes. We dicussed metformin including benefits and risks. She was informed that eating too many simple carbohydrates or too many calories at one sitting increases the likelihood of GI side effects. Elizabeth Brooks will continue metformin for now and a prescription  was not written today. Elizabeth Brooks agreed to follow up with [place] as directed to monitor her progress.  Obesity Elizabeth Brooks is currently in the action stage of change. As such, her goal is to continue with weight loss efforts She has agreed to keep a food journal with 1200 calories and 85 grams of protein daily Elizabeth Brooks has been instructed to work up to a goal of 150 minutes of combined cardio and strengthening exercise per week for weight loss and overall health benefits. We discussed the following Behavioral Modification Strategies today: increasing lean protein intake and work on meal planning and easy cooking plans  Elizabeth Brooks has agreed to follow up with our clinic in 2 weeks. She was informed of the importance of frequent follow up visits to maximize her success with intensive lifestyle modifications for her multiple health conditions.  ALLERGIES: Allergies  Allergen Reactions   Penicillins Anaphylaxis and Rash    MEDICATIONS: Current Outpatient Medications on File Prior to Visit  Medication Sig Dispense Refill   caffeine 200 MG TABS tablet Take 200 mg by mouth every 4 (four) hours as needed.     celecoxib (CELEBREX) 200 MG capsule Take 1 capsule (200 mg total) by mouth 2 (two) times daily. 180 capsule 0   linaclotide (LINZESS) 290 MCG CAPS capsule Take 1 capsule (290 mcg total) by mouth daily before breakfast. 180 capsule 3   metFORMIN (GLUCOPHAGE) 500 MG tablet Take 1 tablet (500 mg total) by mouth daily with breakfast. 30 tablet 0   omeprazole (PRILOSEC) 20 MG capsule TAKE 1 CAPSULE (20 MG TOTAL) BY MOUTH TWICE A DAY BEFORE A MEAL. 180 capsule 3   No current facility-administered medications on file prior to visit.     PAST MEDICAL HISTORY: Past Medical History:  Diagnosis Date   Anxiety    Fibroid    Gastroesophageal reflux    GERD (gastroesophageal reflux disease)    Hyperlipidemia    Hypertension    IBS (irritable bowel syndrome)    Joint pain    Osteoarthritis     both knees   Prediabetes    Prediabetes     PAST SURGICAL HISTORY: Past Surgical History:  Procedure Laterality Date   CESAREAN SECTION     x 2   EYE SURGERY Right    KNEE ARTHROSCOPY Right    KNEE SURGERY Right    removal of large cyst-non-cancerous   TONSILLECTOMY     UMBILICAL HERNIA REPAIR     WISDOM TOOTH EXTRACTION      SOCIAL HISTORY: Social History   Tobacco Use   Smoking status: Former Smoker    Packs/day: 1.00    Years: 26.00    Pack years: 26.00    Types: Cigarettes   Smokeless tobacco: Never Used  Substance Use Topics   Alcohol use: No   Drug use: No    FAMILY HISTORY: Family History  Problem Relation Age of Onset   Hyperlipidemia Mother    Hypertension Mother    Diabetes Mother    Stroke Sister    Heart disease Maternal Grandmother    Colon cancer Neg Hx     ROS: Review of Systems  Constitutional: Positive for weight loss.  Cardiovascular: Negative for chest pain.  Gastrointestinal: Negative for diarrhea, nausea and vomiting.  Musculoskeletal:       Negative for muscle weakness    PHYSICAL EXAM: Blood pressure 122/79, pulse (!) 57, temperature 98.4 F (36.9 C), temperature source Oral, height 5\' 4"  (1.626 m), weight 182 lb (82.6 kg), last menstrual period 10/07/2014, SpO2 98 %. Body mass index is 31.24 kg/m. Physical Exam Vitals signs reviewed.  Constitutional:      Appearance: Normal appearance. She is well-developed. She is obese.  Cardiovascular:     Rate and Rhythm: Normal rate.  Pulmonary:     Effort: Pulmonary effort is normal.  Musculoskeletal: Normal range of motion.  Skin:    General: Skin is warm and dry.  Neurological:     Mental Status: She is alert and oriented to person, place, and time.  Psychiatric:        Mood and Affect: Mood normal.        Behavior: Behavior normal.     RECENT LABS AND TESTS: BMET    Component Value Date/Time   NA 141 08/31/2018 1352   K 5.0 08/31/2018 1352   CL 104  08/31/2018 1352   CO2 23 08/31/2018 1352   GLUCOSE 93 08/31/2018 1352   GLUCOSE 99 10/08/2016 0939   BUN 13 08/31/2018 1352   CREATININE 0.88 08/31/2018 1352   CALCIUM 9.4 08/31/2018 1352   GFRNONAA 74 08/31/2018 1352   GFRAA 85 08/31/2018 1352   Lab Results  Component Value Date   HGBA1C 5.6 08/31/2018   HGBA1C 5.9 (H) 02/16/2018   HGBA1C 6.0 10/08/2016   HGBA1C 5.9 10/05/2015   Lab Results  Component Value Date   INSULIN 8.7 08/31/2018   INSULIN 8.3 02/16/2018   CBC    Component Value Date/Time   WBC 6.5 02/16/2018 1030   WBC 7.7 10/08/2016 0939   RBC 4.64 02/16/2018 1030   RBC 4.71 10/08/2016 0939   HGB 12.8 02/16/2018 1030   HCT 38.1 02/16/2018 1030   PLT 311.0 10/08/2016 0939   MCV 82 02/16/2018 1030   MCH 27.6 02/16/2018 1030   MCHC 33.6 02/16/2018 1030   MCHC 32.3 10/08/2016 0939   RDW 14.9 02/16/2018 1030   LYMPHSABS 2.4 02/16/2018 1030   EOSABS 0.2 02/16/2018 1030   BASOSABS 0.0 02/16/2018 1030   Iron/TIBC/Ferritin/ %Sat No results found for: IRON, TIBC, FERRITIN, IRONPCTSAT Lipid Panel     Component Value Date/Time   CHOL 251 (H) 08/31/2018 1352   TRIG 97 08/31/2018 1352   HDL 56 08/31/2018 1352   CHOLHDL 4.5 (H) 08/31/2018 1352   CHOLHDL 5 10/08/2016 0939   VLDL 23.2 10/08/2016 0939   LDLCALC 176 (H) 08/31/2018 1352   Hepatic Function Panel     Component Value Date/Time   PROT 7.4 08/31/2018 1352   ALBUMIN 4.7 08/31/2018 1352   AST 21 08/31/2018 1352   ALT 12 08/31/2018 1352   ALKPHOS 59 08/31/2018 1352   BILITOT 0.4 08/31/2018 1352      Component Value Date/Time   TSH 1.950 02/16/2018 1030   TSH 1.71 10/08/2016 0939     Ref. Range 02/16/2018 10:30  Vitamin D, 25-Hydroxy Latest Ref Range: 30.0 - 100.0 ng/mL 24.6 (L)    OBESITY BEHAVIORAL INTERVENTION VISIT  Today's visit was # 6   Starting weight: 185 lbs Starting date: 02/16/2018 Today's weight : 182 lbs Today's date: 08/31/2018 Total lbs lost to date: 3    08/31/2018    Height 5\' 4"  (1.626 m)  Weight 182 lb (82.6 kg)  BMI (Calculated) 31.22  BLOOD PRESSURE - SYSTOLIC [id number]  BLOOD PRESSURE - DIASTOLIC 79   Body Fat % 0000000 %  Total Body Water (lbs) 73.8 lbs    ASK: We discussed the diagnosis of obesity with Elizabeth Brooks today and Elizabeth Brooks agreed to give [place] permission to discuss obesity behavioral modification therapy today.  ASSESS: Elizabeth Brooks has the diagnosis of obesity and her BMI today is 31.22 Elizabeth Brooks is in the action stage of change   ADVISE: Elizabeth Brooks was educated on the multiple health risks of obesity as well as the benefit of weight loss to improve her health. She was advised of the need for long term treatment and the importance of lifestyle modifications to improve her current health and to decrease her risk of future health problems.  AGREE: Multiple dietary modification options and treatment options were discussed and  Elizabeth Brooks agreed to follow the recommendations documented in the above note.  ARRANGE: Elizabeth Brooks was educated on the importance of frequent visits to treat obesity as outlined per CMS and USPSTF guidelines and agreed to schedule her next follow up appointment today.  Elizabeth Brooks Skains, am acting as transcriptionist for Abby Potash, PA-C I, Abby Potash, PA-C have reviewed above note and agree with its content

## 2018-09-14 ENCOUNTER — Ambulatory Visit (INDEPENDENT_AMBULATORY_CARE_PROVIDER_SITE_OTHER): Payer: 59 | Admitting: Physician Assistant

## 2018-09-20 MED FILL — OMEPRAZOLE 20 MG CAP: 20 | 90 days supply | Qty: 180 | Fill #0

## 2018-09-28 ENCOUNTER — Ambulatory Visit (INDEPENDENT_AMBULATORY_CARE_PROVIDER_SITE_OTHER): Payer: 59 | Admitting: Physician Assistant

## 2018-09-30 ENCOUNTER — Ambulatory Visit (INDEPENDENT_AMBULATORY_CARE_PROVIDER_SITE_OTHER): Payer: 59 | Admitting: Physician Assistant

## 2018-09-30 ENCOUNTER — Encounter (INDEPENDENT_AMBULATORY_CARE_PROVIDER_SITE_OTHER): Payer: Self-pay | Admitting: Physician Assistant

## 2018-09-30 ENCOUNTER — Other Ambulatory Visit: Payer: Self-pay

## 2018-09-30 VITALS — BP 149/89 | HR 55 | Temp 97.6°F | Ht 64.0 in | Wt 183.0 lb

## 2018-09-30 DIAGNOSIS — R7303 Prediabetes: Secondary | ICD-10-CM | POA: Diagnosis not present

## 2018-09-30 DIAGNOSIS — E669 Obesity, unspecified: Secondary | ICD-10-CM | POA: Diagnosis not present

## 2018-09-30 DIAGNOSIS — Z6831 Body mass index (BMI) 31.0-31.9, adult: Secondary | ICD-10-CM

## 2018-09-30 DIAGNOSIS — E559 Vitamin D deficiency, unspecified: Secondary | ICD-10-CM | POA: Diagnosis not present

## 2018-09-30 DIAGNOSIS — Z9189 Other specified personal risk factors, not elsewhere classified: Secondary | ICD-10-CM

## 2018-09-30 MED ORDER — VITAMIN D (ERGOCALCIFEROL) 1.25 MG (50000 UNIT) PO CAPS: 50000.0000 [IU] | ORAL_CAPSULE | ORAL | 0 refills | Status: DC

## 2018-09-30 MED FILL — VIT D2 1.25 MG (50,000 UNIT: 1.25 MG | 28 days supply | Qty: 4 | Fill #0

## 2018-10-05 NOTE — Progress Notes (Signed)
Office: [phone]  /  Fax: [phone]   HPI:   Chief Complaint: OBESITY Elizabeth Brooks is here to discuss her progress with her obesity treatment plan. She is on the lower carbohydrate, vegetable and lean protein rich diet plan and is following her eating plan approximately 90 % of the time. She states she is walking and biking 40 to 50 minutes 4 times per week. Elizabeth Brooks is overeating her calories and she is under-eating her protein most days. Her weight is 183 lb (83 kg) today and has had a weight gain of 1 pound over a period of 4 weeks since her last visit. She has lost 2 lbs since starting treatment with [place].  Vitamin D deficiency Elizabeth Brooks has a diagnosis of vitamin D deficiency. Elizabeth Brooks is currently taking vit D and she denies nausea, vomiting or muscle weakness.  Pre-Diabetes Elizabeth Brooks has a diagnosis of prediabetes based on her elevated Hgb A1c and was informed this puts her at greater risk of developing diabetes. She is not taking medications currently and continues to work on diet and exercise to decrease risk of diabetes. She denies polyphagia.  At risk for diabetes Elizabeth Brooks is at higher than average risk for developing diabetes due to her obesity and prediabetes. She currently denies polyuria or polydipsia.  ASSESSMENT AND PLAN:  Vitamin D deficiency - Plan: Vitamin D, Ergocalciferol, (DRISDOL) 1.25 MG (50000 UT) CAPS capsule  Prediabetes  At risk for diabetes mellitus  Class 1 obesity with serious comorbidity and body mass index (BMI) of 31.0 to 31.9 in adult, unspecified obesity type  PLAN:  Vitamin D Deficiency Elizabeth Brooks was informed that low vitamin D levels contributes to fatigue and are associated with obesity, breast, and colon cancer. Elizabeth Brooks agrees to continue to take prescription Vit D @50 ,000 IU every week #4 with no refills and she will follow up for routine testing of vitamin D, at least 2-3 times per year. She was informed of the risk of over-replacement of vitamin  D and agrees to not increase her dose unless she discusses this with [place] first. Elizabeth Brooks agrees to follow up with our clinic in 4 weeks.  Pre-Diabetes Elizabeth Brooks will continue to work on weight loss, exercise, and decreasing simple carbohydrates in her diet to help decrease the risk of diabetes. She was informed that eating too many simple carbohydrates or too many calories at one sitting increases the likelihood of GI side effects. Elizabeth Brooks will follow up with [place] as directed to monitor her progress.  Diabetes risk counseling Elizabeth Brooks was given extended (15 minutes) diabetes prevention counseling today. She is 58 y.o. female and has risk factors for diabetes including obesity and prediabetes. We discussed intensive lifestyle modifications today with an emphasis on weight loss as well as increasing exercise and decreasing simple carbohydrates in her diet.  Obesity Elizabeth Brooks is currently in the action stage of change. As such, her goal is to continue with weight loss efforts She has agreed to follow a lower carbohydrate, vegetable and lean protein rich diet plan Elizabeth Brooks has been instructed to work up to a goal of 150 minutes of combined cardio and strengthening exercise per week for weight loss and overall health benefits. We discussed the following Behavioral Modification Strategies today: keeping healthy foods in the home and work on meal planning and easy cooking plans  Elizabeth Brooks has agreed to follow up with our clinic in 4 weeks. She was informed of the importance of frequent follow up visits to maximize her success with intensive lifestyle modifications for her  multiple health conditions.  ALLERGIES: Allergies  Allergen Reactions   Penicillins Anaphylaxis and Rash    MEDICATIONS: Current Outpatient Medications on File Prior to Visit  Medication Sig Dispense Refill   caffeine 200 MG TABS tablet Take 200 mg by mouth every 4 (four) hours as needed.     celecoxib (CELEBREX) 200 MG capsule Take 1  capsule (200 mg total) by mouth 2 (two) times daily. 180 capsule 0   linaclotide (LINZESS) 290 MCG CAPS capsule Take 1 capsule (290 mcg total) by mouth daily before breakfast. 180 capsule 3   omeprazole (PRILOSEC) 20 MG capsule TAKE 1 CAPSULE (20 MG TOTAL) BY MOUTH TWICE A DAY BEFORE A MEAL. 180 capsule 3   No current facility-administered medications on file prior to visit.     PAST MEDICAL HISTORY: Past Medical History:  Diagnosis Date   Anxiety    Fibroid    Gastroesophageal reflux    GERD (gastroesophageal reflux disease)    Hyperlipidemia    Hypertension    IBS (irritable bowel syndrome)    Joint pain    Osteoarthritis    both knees   Prediabetes    Prediabetes     PAST SURGICAL HISTORY: Past Surgical History:  Procedure Laterality Date   CESAREAN SECTION     x 2   EYE SURGERY Right    KNEE ARTHROSCOPY Right    KNEE SURGERY Right    removal of large cyst-non-cancerous   TONSILLECTOMY     UMBILICAL HERNIA REPAIR     WISDOM TOOTH EXTRACTION      SOCIAL HISTORY: Social History   Tobacco Use   Smoking status: Former Smoker    Packs/day: 1.00    Years: 26.00    Pack years: 26.00    Types: Cigarettes   Smokeless tobacco: Never Used  Substance Use Topics   Alcohol use: No   Drug use: No    FAMILY HISTORY: Family History  Problem Relation Age of Onset   Hyperlipidemia Mother    Hypertension Mother    Diabetes Mother    Stroke Sister    Heart disease Maternal Grandmother    Colon cancer Neg Hx     ROS: Review of Systems  Constitutional: Negative for weight loss.  Gastrointestinal: Negative for nausea and vomiting.  Genitourinary: Negative for frequency.  Musculoskeletal:       Negative for muscle weakness  Endo/Heme/Allergies: Negative for polydipsia.       Negative for polyphagia    PHYSICAL EXAM: Blood pressure (!) 149/89, pulse (!) 55, temperature 97.6 F (36.4 C), temperature source Oral, height 5\' 4"  (1.626  m), weight 183 lb (83 kg), last menstrual period 10/07/2014, SpO2 100 %. Body mass index is 31.41 kg/m. Physical Exam Vitals signs reviewed.  Constitutional:      Appearance: Normal appearance. She is well-developed. She is obese.  Cardiovascular:     Rate and Rhythm: Normal rate.  Pulmonary:     Effort: Pulmonary effort is normal.  Musculoskeletal: Normal range of motion.  Skin:    General: Skin is warm and dry.  Neurological:     Mental Status: She is alert and oriented to person, place, and time.  Psychiatric:        Mood and Affect: Mood normal.        Behavior: Behavior normal.     RECENT LABS AND TESTS: BMET    Component Value Date/Time   NA 141 08/31/2018 1352   K 5.0 08/31/2018 1352   CL 104  08/31/2018 1352   CO2 23 08/31/2018 1352   GLUCOSE 93 08/31/2018 1352   GLUCOSE 99 10/08/2016 0939   BUN 13 08/31/2018 1352   CREATININE 0.88 08/31/2018 1352   CALCIUM 9.4 08/31/2018 1352   GFRNONAA 74 08/31/2018 1352   GFRAA 85 08/31/2018 1352   Lab Results  Component Value Date   HGBA1C 5.6 08/31/2018   HGBA1C 5.9 (H) 02/16/2018   HGBA1C 6.0 10/08/2016   HGBA1C 5.9 10/05/2015   Lab Results  Component Value Date   INSULIN 8.7 08/31/2018   INSULIN 8.3 02/16/2018   CBC    Component Value Date/Time   WBC 6.5 02/16/2018 1030   WBC 7.7 10/08/2016 0939   RBC 4.64 02/16/2018 1030   RBC 4.71 10/08/2016 0939   HGB 12.8 02/16/2018 1030   HCT 38.1 02/16/2018 1030   PLT 311.0 10/08/2016 0939   MCV 82 02/16/2018 1030   MCH 27.6 02/16/2018 1030   MCHC 33.6 02/16/2018 1030   MCHC 32.3 10/08/2016 0939   RDW 14.9 02/16/2018 1030   LYMPHSABS 2.4 02/16/2018 1030   EOSABS 0.2 02/16/2018 1030   BASOSABS 0.0 02/16/2018 1030   Iron/TIBC/Ferritin/ %Sat No results found for: IRON, TIBC, FERRITIN, IRONPCTSAT Lipid Panel     Component Value Date/Time   CHOL 251 (H) 08/31/2018 1352   TRIG 97 08/31/2018 1352   HDL 56 08/31/2018 1352   CHOLHDL 4.5 (H) 08/31/2018 1352    CHOLHDL 5 10/08/2016 0939   VLDL 23.2 10/08/2016 0939   LDLCALC 176 (H) 08/31/2018 1352   Hepatic Function Panel     Component Value Date/Time   PROT 7.4 08/31/2018 1352   ALBUMIN 4.7 08/31/2018 1352   AST 21 08/31/2018 1352   ALT 12 08/31/2018 1352   ALKPHOS 59 08/31/2018 1352   BILITOT 0.4 08/31/2018 1352      Component Value Date/Time   TSH 1.950 02/16/2018 1030   TSH 1.71 10/08/2016 0939     Ref. Range 08/31/2018 13:52  Vitamin D, 25-Hydroxy Latest Ref Range: 30.0 - 100.0 ng/mL 36.1    OBESITY BEHAVIORAL INTERVENTION VISIT  Today's visit was # 6   Starting weight: 185 lbs Starting date: 02/16/2018 Today's weight : 183 lbs Today's date: 09/30/2018 Total lbs lost to date: 2    09/30/2018  Height 5\' 4"  (1.626 m)  Weight 183 lb (83 kg)  BMI (Calculated) 31.4  BLOOD PRESSURE - SYSTOLIC [id number]  BLOOD PRESSURE - DIASTOLIC 89   Body Fat % 43 %  Total Body Water (lbs) 75.4 lbs    ASK: We discussed the diagnosis of obesity with Elizabeth Brooks today and Elizabeth Brooks agreed to give [place] permission to discuss obesity behavioral modification therapy today.  ASSESS: Elizabeth Brooks has the diagnosis of obesity and her BMI today is 31.4 Elizabeth Brooks is in the action stage of change   ADVISE: Elizabeth Brooks was educated on the multiple health risks of obesity as well as the benefit of weight loss to improve her health. She was advised of the need for long term treatment and the importance of lifestyle modifications to improve her current health and to decrease her risk of future health problems.  AGREE: Multiple dietary modification options and treatment options were discussed and  Elizabeth Brooks agreed to follow the recommendations documented in the above note.  ARRANGE: Elizabeth Brooks was educated on the importance of frequent visits to treat obesity as outlined per CMS and USPSTF guidelines and agreed to schedule her next follow up appointment today.  Corey Skains, am acting as  transcriptionist for Abby Potash, PA-C I, Abby Potash, PA-C have reviewed above note and agree with its content

## 2018-10-18 ENCOUNTER — Encounter: Payer: Self-pay | Admitting: Obstetrics and Gynecology

## 2018-10-18 ENCOUNTER — Ambulatory Visit (INDEPENDENT_AMBULATORY_CARE_PROVIDER_SITE_OTHER): Payer: 59 | Admitting: Obstetrics and Gynecology

## 2018-10-18 ENCOUNTER — Other Ambulatory Visit: Payer: Self-pay | Admitting: Obstetrics and Gynecology

## 2018-10-18 ENCOUNTER — Other Ambulatory Visit: Payer: Self-pay

## 2018-10-18 VITALS — BP 118/80 | HR 64 | Temp 97.2°F | Ht 64.0 in | Wt 186.6 lb

## 2018-10-18 DIAGNOSIS — Z01419 Encounter for gynecological examination (general) (routine) without abnormal findings: Secondary | ICD-10-CM | POA: Diagnosis not present

## 2018-10-18 DIAGNOSIS — N949 Unspecified condition associated with female genital organs and menstrual cycle: Secondary | ICD-10-CM | POA: Diagnosis not present

## 2018-10-18 DIAGNOSIS — Z1231 Encounter for screening mammogram for malignant neoplasm of breast: Secondary | ICD-10-CM

## 2018-10-18 NOTE — Progress Notes (Signed)
58 y.o. DE:[id number] Married Other or two or more races Not Hispanic or Latino female here for annual exam. Patient is getting flu vaccine on Wednesday.  No vaginal bleeding.  She has IBS/constipation on Linzess  Sexually active, no pain.     Patient's last menstrual period was 10/07/2014 (approximate).          Sexually active: Yes.    The current method of family planning is post menopausal status.    Exercising: Yes.    walking, biking, home rowing Smoker:  no  Health Maintenance: Pap:  10/12/2017 WNL NEG HPV, 10/19/2014 normal History of abnormal Pap:  no MMG:  11/18/2016 Birads 1 negative Colonoscopy: 07-15-12 polyps, f/u in 10 years. TDaP:  01/15/2011 Gardasil: N/A   reports that she has quit smoking. Her smoking use included cigarettes. She has a 26.00 pack-year smoking history. She has never used smokeless tobacco. She reports that she does not drink alcohol or use drugs. 2 grown kids, one in [place] (currenlty here), one in [place]. 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 Bothwith degrees in music.Former Licensed conveyancer, she now works in Engineer, technical sales for [employer].  Husband is a MD in [facility]  Past Medical History:  Diagnosis Date  . Anxiety   . Fibroid   . Gastroesophageal reflux   . GERD (gastroesophageal reflux disease)   . Hyperlipidemia   . Hypertension   . IBS (irritable bowel syndrome)   . Joint pain   . Osteoarthritis    both knees  . Prediabetes   . Prediabetes     Past Surgical History:  Procedure Laterality Date  . CESAREAN SECTION     x 2  . EYE SURGERY Right   . KNEE ARTHROSCOPY Right   . KNEE SURGERY Right    removal of large cyst-non-cancerous  . TONSILLECTOMY    . UMBILICAL HERNIA REPAIR    . WISDOM TOOTH EXTRACTION      Current Outpatient Medications  Medication Sig Dispense Refill  . caffeine 200 MG TABS tablet Take 200 mg by mouth every 4 (four) hours as needed.    . celecoxib (CELEBREX) 200 MG capsule Take 1 capsule (200 mg total) by mouth 2 (two) times daily. 180 capsule 0  .  linaclotide (LINZESS) 290 MCG CAPS capsule Take 1 capsule (290 mcg total) by mouth daily before breakfast. 180 capsule 3  . omeprazole (PRILOSEC) 20 MG capsule TAKE 1 CAPSULE (20 MG TOTAL) BY MOUTH TWICE A DAY BEFORE A MEAL. 180 capsule 3  . Vitamin D, Ergocalciferol, (DRISDOL) 1.25 MG (50000 UT) CAPS capsule Take 1 capsule (50,000 Units total) by mouth every 7 (seven) days. 4 capsule 0   No current facility-administered medications for this visit.     Family History  Problem Relation Age of Onset  . Hyperlipidemia Mother   . Hypertension Mother   . Diabetes Mother   . Stroke Sister   . Heart disease Maternal Grandmother   . Colon cancer Neg Hx     Review of Systems  Constitutional: Negative.   HENT: Negative.   Eyes: Negative.   Respiratory: Negative.   Cardiovascular: Negative.   Gastrointestinal: Negative.   Endocrine: Negative.   Genitourinary: Negative.   Musculoskeletal: Negative.   Skin: Negative.   Allergic/Immunologic: Negative.   Neurological: Negative.   Hematological: Negative.   Psychiatric/Behavioral: Negative.     Exam:   BP 118/80 (BP Location: Right Arm, Patient Position: Sitting, Cuff Size: Normal)   Pulse 64   Temp (!) 97.2 F (36.2 C) (Skin)  Ht 5\' 4"  (1.626 m)   Wt 186 lb 9.6 oz (84.6 kg)   LMP 10/07/2014 (Approximate)   BMI 32.03 kg/m   Weight change: @WEIGHTCHANGE @ Height:   Height: 5\' 4"  (162.6 cm)  Ht Readings from Last 3 Encounters:  10/18/18 5\' 4"  (1.626 m)  09/30/18 5\' 4"  (1.626 m)  08/31/18 5\' 4"  (1.626 m)    General appearance: alert, cooperative and appears stated age Head: Normocephalic, without obvious abnormality, atraumatic Neck: no adenopathy, supple, symmetrical, trachea midline and thyroid normal to inspection and palpation Lungs: clear to auscultation bilaterally Cardiovascular: regular rate and rhythm Breasts: normal appearance, no masses or tenderness Abdomen: soft, non-tender; non distended,  no masses,  no  organomegaly Extremities: extremities normal, atraumatic, no cyanosis or edema Skin: Skin color, texture, turgor normal. No rashes or lesions Lymph nodes: Cervical, supraclavicular, and axillary nodes normal. No abnormal inguinal nodes palpated Neurologic: Grossly normal   Pelvic: External genitalia:  no lesions              Urethra:  normal appearing urethra with no masses, tenderness or lesions              Bartholins and Skenes: normal                 Vagina: normal appearing vagina with normal color and discharge, no lesions              Cervix: no lesions               Bimanual Exam:  Uterus:  uterus mobile, not tender, fullness felt toward the left hard to differentiate from the uterus              Adnexa: not tender, fullness on the left.                Rectovaginal: Confirms               Anus:  normal sphincter tone, no lesions  Chaperone was present for exam.  A:  Well Woman with normal exam  Fullness left adnexal region  P:   No pap this year  Discussed breast self exam  Discussed calcium and vit D intake  Labs UTD  Colonoscopy UTD  Mammogram due, she will schedule.   Return for repeat exam, if still with fullness will set her up for an ultrasound.

## 2018-10-18 NOTE — Patient Instructions (Signed)

## 2018-10-21 NOTE — Progress Notes (Deleted)
GYNECOLOGY  VISIT   HPI: 58 y.o.   Married Other or two or more races Not Hispanic or Latino  female   [phone] with Patient's last menstrual period was 10/07/2014 (approximate).   here for     GYNECOLOGIC HISTORY: Patient's last menstrual period was 10/07/2014 (approximate). Contraception:*** Menopausal hormone therapy: ***        OB History    Gravida  2   Para  2   Term  2   Preterm  0   AB  0   Living  2     SAB  0   TAB  0   Ectopic  0   Multiple  0   Live Births  2              Patient Active Problem List   Diagnosis Date Noted  . Subluxation of extensor carpi ulnaris tendon, right, initial encounter 04/10/2017  . Fibroid   . Patellofemoral syndrome of both knees 10/19/2015  . Pes planus 10/19/2015  . Generalized anxiety disorder 08/23/2015  . Environmental allergies 08/23/2015  . Irritable bowel syndrome with constipation 12/29/2014  . Osteoarthritis 12/28/2014  . GERD (gastroesophageal reflux disease) 02/06/2011    Past Medical History:  Diagnosis Date  . Anxiety   . Fibroid   . Gastroesophageal reflux   . GERD (gastroesophageal reflux disease)   . Hyperlipidemia   . Hypertension   . IBS (irritable bowel syndrome)   . Joint pain   . Osteoarthritis    both knees  . Prediabetes   . Prediabetes     Past Surgical History:  Procedure Laterality Date  . CESAREAN SECTION     x 2  . EYE SURGERY Right   . KNEE ARTHROSCOPY Right   . KNEE SURGERY Right    removal of large cyst-non-cancerous  . TONSILLECTOMY    . UMBILICAL HERNIA REPAIR    . WISDOM TOOTH EXTRACTION      Current Outpatient Medications  Medication Sig Dispense Refill  . caffeine 200 MG TABS tablet Take 200 mg by mouth every 4 (four) hours as needed.    . celecoxib (CELEBREX) 200 MG capsule Take 1 capsule (200 mg total) by mouth 2 (two) times daily. 180 capsule 0  . linaclotide (LINZESS) 290 MCG CAPS capsule Take 1 capsule (290 mcg total) by mouth daily before breakfast.  180 capsule 3  . omeprazole (PRILOSEC) 20 MG capsule TAKE 1 CAPSULE (20 MG TOTAL) BY MOUTH TWICE A DAY BEFORE A MEAL. 180 capsule 3  . Vitamin D, Ergocalciferol, (DRISDOL) 1.25 MG (50000 UT) CAPS capsule Take 1 capsule (50,000 Units total) by mouth every 7 (seven) days. 4 capsule 0   No current facility-administered medications for this visit.      ALLERGIES: Penicillins  Family History  Problem Relation Age of Onset  . Hyperlipidemia Mother   . Hypertension Mother   . Diabetes Mother   . Stroke Sister   . Heart disease Maternal Grandmother   . Colon cancer Neg Hx     Social History   Socioeconomic History  . Marital status: Married    Spouse name: Persayis Wead  . Number of children: 2  . Years of education: Not on file  . Highest education level: Not on file  Occupational History  . 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 Financial resource strain: Not on file  . Food insecurity    Worry: Not on file    Inability: Not  on file  . Transportation needs    Medical: Not on file    Non-medical: Not on file  Tobacco Use  . Smoking status: Former Smoker    Packs/day: 1.00    Years: 26.00    Pack years: 26.00    Types: Cigarettes  . Smokeless tobacco: Never Used  Substance and Sexual Activity  . Alcohol use: No  . Drug use: No  . Sexual activity: Yes    Partners: Male    Birth control/protection: Post-menopausal  Lifestyle  . Physical activity    Days per week: Not on file    Minutes per session: Not on file  . Stress: Not on file  Relationships  . Social Herbalist on phone: Not on file    Gets together: Not on file    Attends religious service: Not on file    Active member of club or organization: Not on file    Attends meetings of clubs or organizations: Not on file    Relationship status: Not on file  . Intimate partner violence    Fear of current or ex partner: Not on file    Emotionally abused: Not on file    Physically abused: Not on  file    Forced sexual activity: Not on file  Other Topics Concern  . Not on file  Social History Narrative  . Not on file    ROS  PHYSICAL EXAMINATION:    LMP 10/07/2014 (Approximate)     General appearance: alert, cooperative and appears stated age Neck: no adenopathy, supple, symmetrical, trachea midline and thyroid {CHL AMB PHY EX THYROID NORM DEFAULT:[phone]::"normal to inspection and palpation"} Breasts: {Exam; breast:13139::"normal appearance, no masses or tenderness"} Abdomen: soft, non-tender; non distended, no masses,  no organomegaly  Pelvic: External genitalia:  no lesions              Urethra:  normal appearing urethra with no masses, tenderness or lesions              Bartholins and Skenes: normal                 Vagina: normal appearing vagina with normal color and discharge, no lesions              Cervix: {CHL AMB PHY EX CERVIX NORM DEFAULT:[phone]::"no lesions"}              Bimanual Exam:  Uterus:  {CHL AMB PHY EX UTERUS NORM [phone]::"normal size, contour, position, consistency, mobility, non-tender"}              Adnexa: {CHL AMB PHY EX ADNEXA NO MASS DEFAULT:[phone]::"no mass, fullness, tenderness"}              Rectovaginal: {yes no:[phone]}.  Confirms.              Anus:  normal sphincter tone, no lesions  Chaperone was present for exam.  ASSESSMENT     PLAN    An After Visit Summary was printed and given to the patient.  *** minutes face to face time of which over 50% was spent in counseling.

## 2018-10-27 ENCOUNTER — Ambulatory Visit: Payer: 59 | Admitting: Obstetrics and Gynecology

## 2018-10-28 ENCOUNTER — Ambulatory Visit (INDEPENDENT_AMBULATORY_CARE_PROVIDER_SITE_OTHER): Payer: 59 | Admitting: Physician Assistant

## 2018-11-01 ENCOUNTER — Encounter: Payer: Self-pay | Admitting: Obstetrics and Gynecology

## 2018-11-01 ENCOUNTER — Ambulatory Visit: Payer: 59 | Admitting: Obstetrics and Gynecology

## 2018-11-01 ENCOUNTER — Other Ambulatory Visit: Payer: Self-pay

## 2018-11-01 VITALS — BP 118/82 | HR 60 | Temp 97.1°F | Wt 187.2 lb

## 2018-11-01 DIAGNOSIS — Z09 Encounter for follow-up examination after completed treatment for conditions other than malignant neoplasm: Secondary | ICD-10-CM

## 2018-11-01 NOTE — Patient Instructions (Signed)

## 2018-11-01 NOTE — Progress Notes (Signed)
GYNECOLOGY  VISIT   HPI: 58 y.o.   Married Other or two or more races Not Hispanic or Latino  female   [phone] with Patient's last menstrual period was 10/07/2014 (approximate).   here for 1 week recheck. She was noted to have fullness in her left adnexal region (difficult to differentiate from the uterus) at her annual exam a few weeks ago. She states she had a large BM after her last visit. Has issues with constipation off and on.  GYNECOLOGIC HISTORY: Patient's last menstrual period was 10/07/2014 (approximate). Contraception: Postmenopausal Menopausal hormone therapy: None        OB History    Gravida  2   Para  2   Term  2   Preterm  0   AB  0   Living  2     SAB  0   TAB  0   Ectopic  0   Multiple  0   Live Births  2              Patient Active Problem List   Diagnosis Date Noted  . Subluxation of extensor carpi ulnaris tendon, right, initial encounter 04/10/2017  . Fibroid   . Patellofemoral syndrome of both knees 10/19/2015  . Pes planus 10/19/2015  . Generalized anxiety disorder 08/23/2015  . Environmental allergies 08/23/2015  . Irritable bowel syndrome with constipation 12/29/2014  . Osteoarthritis 12/28/2014  . GERD (gastroesophageal reflux disease) 02/06/2011    Past Medical History:  Diagnosis Date  . Anxiety   . Fibroid   . Gastroesophageal reflux   . GERD (gastroesophageal reflux disease)   . Hyperlipidemia   . Hypertension   . IBS (irritable bowel syndrome)   . Joint pain   . Osteoarthritis    both knees  . Prediabetes   . Prediabetes     Past Surgical History:  Procedure Laterality Date  . CESAREAN SECTION     x 2  . EYE SURGERY Right   . KNEE ARTHROSCOPY Right   . KNEE SURGERY Right    removal of large cyst-non-cancerous  . TONSILLECTOMY    . UMBILICAL HERNIA REPAIR    . WISDOM TOOTH EXTRACTION      Current Outpatient Medications  Medication Sig Dispense Refill  . caffeine 200 MG TABS tablet Take 200 mg by mouth  every 4 (four) hours as needed.    . celecoxib (CELEBREX) 200 MG capsule Take 1 capsule (200 mg total) by mouth 2 (two) times daily. 180 capsule 0  . linaclotide (LINZESS) 290 MCG CAPS capsule Take 1 capsule (290 mcg total) by mouth daily before breakfast. 180 capsule 3  . omeprazole (PRILOSEC) 20 MG capsule TAKE 1 CAPSULE (20 MG TOTAL) BY MOUTH TWICE A DAY BEFORE A MEAL. 180 capsule 3  . Vitamin D, Ergocalciferol, (DRISDOL) 1.25 MG (50000 UT) CAPS capsule Take 1 capsule (50,000 Units total) by mouth every 7 (seven) days. 4 capsule 0   No current facility-administered medications for this visit.      ALLERGIES: Penicillins  Family History  Problem Relation Age of Onset  . Hyperlipidemia Mother   . Hypertension Mother   . Diabetes Mother   . Stroke Sister   . Heart disease Maternal Grandmother   . Colon cancer Neg Hx     Social History   Socioeconomic History  . Marital status: Married    Spouse name: Novena Paterson  . Number of children: 2  . Years of education: Not on file  .  Highest education level: Not on file  Occupational History  . 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 Financial resource strain: Not on file  . Food insecurity    Worry: Not on file    Inability: Not on file  . Transportation needs    Medical: Not on file    Non-medical: Not on file  Tobacco Use  . Smoking status: Former Smoker    Packs/day: 1.00    Years: 26.00    Pack years: 26.00    Types: Cigarettes  . Smokeless tobacco: Never Used  Substance and Sexual Activity  . Alcohol use: No  . Drug use: No  . Sexual activity: Yes    Partners: Male    Birth control/protection: Post-menopausal  Lifestyle  . Physical activity    Days per week: Not on file    Minutes per session: Not on file  . Stress: Not on file  Relationships  . Social Herbalist on phone: Not on file    Gets together: Not on file    Attends religious service: Not on file    Active member of club or  organization: Not on file    Attends meetings of clubs or organizations: Not on file    Relationship status: Not on file  . Intimate partner violence    Fear of current or ex partner: Not on file    Emotionally abused: Not on file    Physically abused: Not on file    Forced sexual activity: Not on file  Other Topics Concern  . Not on file  Social History Narrative  . Not on file    Review of Systems  Constitutional: Negative.   HENT: Negative.   Eyes: Negative.   Respiratory: Negative.   Cardiovascular: Negative.   Gastrointestinal: Negative.   Genitourinary: Negative.   Musculoskeletal: Negative.   Skin: Negative.   Neurological: Negative.   Endo/Heme/Allergies: Negative.   Psychiatric/Behavioral: Negative.     PHYSICAL EXAMINATION:    BP 118/82 (BP Location: Right Arm, Patient Position: Sitting, Cuff Size: Normal)   Pulse 60   Temp (!) 97.1 F (36.2 C) (Skin)   Wt 187 lb 3.2 oz (84.9 kg)   LMP 10/07/2014 (Approximate)   BMI 32.13 kg/m     General appearance: alert, cooperative and appears stated age  Pelvic: External genitalia:  no lesions              Urethra:  normal appearing urethra with no masses, tenderness or lesions              Bartholins and Skenes: normal                 Cervix: no cervical motion tenderness              Bimanual Exam:  Uterus:  normal size, contour, position, consistency, mobility, non-tender              Adnexa: no mass, fullness, tenderness              Rectovaginal: Yes.  .  Confirms.              Anus:  normal sphincter tone, no lesions  Chaperone was present for exam.  ASSESSMENT Adnexal fullness noted at her annual exam, normal exam today    PLAN Routine follow up   An After Visit Summary was printed and given to the patient.

## 2018-11-15 MED FILL — LINZESS 290 MCG CAPSULE: 290 | 90 days supply | Qty: 90 | Fill #1

## 2018-11-30 ENCOUNTER — Ambulatory Visit
Admission: RE | Admit: 2018-11-30 | Discharge: 2018-11-30 | Disposition: A | Discharge status: Home / Self Care | Payer: 59 | Source: Ambulatory Visit | Attending: Obstetrics and Gynecology | Admitting: Obstetrics and Gynecology

## 2018-11-30 ENCOUNTER — Other Ambulatory Visit: Payer: Self-pay

## 2018-11-30 DIAGNOSIS — Z1231 Encounter for screening mammogram for malignant neoplasm of breast: Secondary | ICD-10-CM

## 2018-12-12 DIAGNOSIS — H524 Presbyopia: Secondary | ICD-10-CM | POA: Diagnosis not present

## 2018-12-21 ENCOUNTER — Telehealth: Payer: Self-pay | Admitting: Orthopaedic Surgery

## 2018-12-21 ENCOUNTER — Other Ambulatory Visit: Payer: Self-pay | Admitting: Physician Assistant

## 2018-12-21 NOTE — Telephone Encounter (Signed)
Can you send in 200mg  daily prn #30

## 2018-12-21 NOTE — Telephone Encounter (Signed)
Pt called in requesting a prescription for Celebrex, and please have that sent to [facility].   [phone]

## 2018-12-22 ENCOUNTER — Other Ambulatory Visit: Payer: Self-pay

## 2018-12-22 MED ORDER — CELECOXIB 200 MG PO CAPS: 200.0000 mg | ORAL_CAPSULE | Freq: Every day | ORAL | 0 refills | Status: DC | PRN

## 2018-12-22 MED FILL — CELECOXIB 200 MG CAP: 200 | 30 days supply | Qty: 30 | Fill #0

## 2018-12-22 NOTE — Telephone Encounter (Signed)
Called patient no answer LMOM. Rx sent to pharm.  

## 2019-01-26 ENCOUNTER — Telehealth: Payer: Self-pay | Admitting: Orthopaedic Surgery

## 2019-01-26 ENCOUNTER — Other Ambulatory Visit: Payer: Self-pay | Admitting: Physician Assistant

## 2019-01-26 MED ORDER — CELECOXIB 200 MG PO CAPS: 200.0000 mg | ORAL_CAPSULE | Freq: Every day | ORAL | 0 refills | Status: DC | PRN

## 2019-01-26 MED FILL — CELECOXIB 200 MG CAP: 200 | 90 days supply | Qty: 90 | Fill #0

## 2019-01-26 NOTE — Telephone Encounter (Signed)
I just sent in

## 2019-01-26 NOTE — Telephone Encounter (Signed)
Patient called.  She is requesting a refill of her Celebrex and she is also wanting it to be a 90 day refill.  Call back number: [phone]

## 2019-02-02 ENCOUNTER — Encounter: Payer: Self-pay | Admitting: Nurse Practitioner

## 2019-05-02 ENCOUNTER — Telehealth: Payer: Self-pay | Admitting: Orthopaedic Surgery

## 2019-05-02 NOTE — Telephone Encounter (Signed)
Rx refill Celebrex-90 day supply   [facility]

## 2019-05-03 ENCOUNTER — Other Ambulatory Visit: Payer: Self-pay

## 2019-05-03 MED ORDER — CELECOXIB 200 MG PO CAPS: 200.0000 mg | ORAL_CAPSULE | Freq: Every day | ORAL | 0 refills | Status: DC | PRN

## 2019-05-03 MED FILL — CELECOXIB 200 MG CAP: 200 | 90 days supply | Qty: 90 | Fill #0

## 2019-05-03 NOTE — Telephone Encounter (Signed)
Called into her pharm. Called patient. No answer lmom.

## 2019-05-03 NOTE — Telephone Encounter (Signed)
Ok to send in.  

## 2019-05-31 MED FILL — LINZESS 290 MCG CAPSULE: 290 | 90 days supply | Qty: 90 | Fill #2

## 2019-07-25 ENCOUNTER — Other Ambulatory Visit: Payer: Self-pay | Admitting: Gastroenterology

## 2019-07-25 ENCOUNTER — Encounter (INDEPENDENT_AMBULATORY_CARE_PROVIDER_SITE_OTHER): Payer: Self-pay | Admitting: Physician Assistant

## 2019-07-25 ENCOUNTER — Telehealth: Payer: Self-pay | Admitting: Physician Assistant

## 2019-07-25 ENCOUNTER — Encounter: Payer: Self-pay | Admitting: Gastroenterology

## 2019-07-25 NOTE — Telephone Encounter (Signed)
Pt is requesting rf for Omeprazole to be sent to [facility] in [place]. She just made an appt on 8/12.

## 2019-07-26 ENCOUNTER — Other Ambulatory Visit: Payer: Self-pay

## 2019-07-26 MED ORDER — OMEPRAZOLE 20 MG PO CPDR: 20.0000 mg | DELAYED_RELEASE_CAPSULE | Freq: Two times a day (BID) | ORAL | 1 refills | Status: DC

## 2019-07-26 MED FILL — OMEPRAZOLE 20 MG CAP: 20 | 30 days supply | Qty: 60 | Fill #0

## 2019-07-26 NOTE — Telephone Encounter (Signed)
Ok to refill x 2 months

## 2019-07-26 NOTE — Telephone Encounter (Signed)
Last seen 2018. Please advise.

## 2019-08-08 ENCOUNTER — Other Ambulatory Visit: Payer: Self-pay | Admitting: Physician Assistant

## 2019-08-18 ENCOUNTER — Other Ambulatory Visit: Payer: Self-pay | Admitting: Gastroenterology

## 2019-08-18 ENCOUNTER — Ambulatory Visit: Payer: 59 | Admitting: Gastroenterology

## 2019-08-18 ENCOUNTER — Encounter: Payer: Self-pay | Admitting: Gastroenterology

## 2019-08-18 VITALS — BP 122/78 | HR 77 | Ht 64.0 in | Wt 183.4 lb

## 2019-08-18 DIAGNOSIS — K5909 Other constipation: Secondary | ICD-10-CM

## 2019-08-18 DIAGNOSIS — K219 Gastro-esophageal reflux disease without esophagitis: Secondary | ICD-10-CM

## 2019-08-18 MED ORDER — OMEPRAZOLE 20 MG PO CPDR: 20.0000 mg | DELAYED_RELEASE_CAPSULE | Freq: Two times a day (BID) | ORAL | 11 refills | Status: DC

## 2019-08-18 MED ORDER — LUBIPROSTONE 24 MCG PO CAPS
24.0000 ug | ORAL_CAPSULE | Freq: Two times a day (BID) | ORAL | 5 refills | Status: DC
Start: 2019-08-18 — End: 2019-10-19

## 2019-08-18 NOTE — Progress Notes (Signed)
08/18/2019 Elizabeth Brooks [id number] 1960-05-10   HISTORY OF PRESENT ILLNESS: This is a pleasant 59 year old female who is known to Dr. Silverio Decamp for history of GERD, IBS/chronic constipation, osteoarthritis, anxiety.  She was last seen here in August 2018.  She is here today in order to obtain medication refill on her omeprazole.  She has been taking omeprazole 20 mg twice daily for her acid reflux, which works well.  She does want to discuss her constipation again, however.  She says that previously the Linzess 290 mcg was working very well.  Recently, however, she feels it is not working as much as it used to.  She has been on phentermine since January for weight loss.  Remotely, back in 2016 she had been on Amitiza 24 mcg twice daily.  She feels that at some point that stopped working for her as well.  She had a sitz marker study back in 2015 that showed no retained markers.  Her last colonoscopy was in July 2014 at which time she was found to have one 3 mm polyp removed from the ascending colon.  Path consistent with lymphoid aggregate and no adenomatous changes.  She also had small internal hemorrhoids.  Repeat recommended at 10-year interval.   Past Medical History:  Diagnosis Date  . Anxiety   . Fibroid   . Gastroesophageal reflux   . GERD (gastroesophageal reflux disease)   . Hyperlipidemia   . Hypertension   . IBS (irritable bowel syndrome)   . Joint pain   . Osteoarthritis    both knees  . Prediabetes   . Prediabetes    Past Surgical History:  Procedure Laterality Date  . CESAREAN SECTION     x 2  . EYE SURGERY Right   . KNEE ARTHROSCOPY Right   . KNEE SURGERY Right    removal of large cyst-non-cancerous  . TONSILLECTOMY    . UMBILICAL HERNIA REPAIR    . WISDOM TOOTH EXTRACTION      reports that she has quit smoking. Her smoking use included cigarettes. She has a 26.00 pack-year smoking history. She has never used smokeless tobacco. She reports that she does not  drink alcohol and does not use drugs. family history includes Diabetes in her mother; Heart disease in her maternal grandmother; Hyperlipidemia in her mother; Hypertension in her mother; Stroke in her sister. Allergies  Allergen Reactions  . Penicillins Anaphylaxis and Rash      Outpatient Encounter Medications as of 08/18/2019  Medication Sig  . linaclotide (LINZESS) 290 MCG CAPS capsule Take 1 capsule (290 mcg total) by mouth daily before breakfast.  . omeprazole (PRILOSEC) 20 MG capsule Take 1 capsule (20 mg total) by mouth 2 (two) times daily before a meal.  . phentermine 37.5 MG capsule Take 37.5 mg by mouth every morning.  . Vitamin D, Ergocalciferol, (DRISDOL) 1.25 MG (50000 UT) CAPS capsule Take 1 capsule (50,000 Units total) by mouth every 7 (seven) days.  . [DISCONTINUED] caffeine 200 MG TABS tablet Take 200 mg by mouth every 4 (four) hours as needed.  . [DISCONTINUED] celecoxib (CELEBREX) 200 MG capsule Take 1 capsule (200 mg total) by mouth daily as needed.   No facility-administered encounter medications on file as of 08/18/2019.     REVIEW OF SYSTEMS  : All other systems reviewed and negative except where noted in the History of Present Illness.   PHYSICAL EXAM: BP 122/78   Pulse 77   Ht 5\' 4"  (1.626 m)  Wt 183 lb 6 oz (83.2 kg)   LMP 10/07/2014 (Approximate)   BMI 31.48 kg/m  General: Well developed AA female in no acute distress Head: Normocephalic and atraumatic Eyes:  Sclerae anicteric, conjunctiva pink. Ears: Normal auditory acuity Lungs: Clear throughout to auscultation; no increased WOB. Heart: Regular rate and rhythm; no M/R/G. Abdomen: Soft, non-distended.  BS present.  Non-tender. Musculoskeletal: Symmetrical with no gross deformities  Skin: No lesions on visible extremities Extremities: No edema  Neurological: Alert oriented x 4, grossly non-focal Psychological:  Alert and cooperative. Normal mood and affect  ASSESSMENT AND PLAN: *IBS-C/CIC:  Previously was doing well on Linzess 290 mcg daily.  Recently feels that this has not been working as well as it had previously.  She has been on phentermine since January as well so question if this could be contributing to some of the change.  We discussed possibly trying Trulance and we do have samples, but it appears it is not on her prescription formulary.  We discussed adding MiraLAX to her Linzess regimen.  We also discussed going back to Amitiza 24 mcg twice daily, which she had taken in the past several years ago.  She decided to retry the Amitiza for now.  Prescription sent to pharmacy.  She will call our office back in about 4 weeks with an update on her symptoms. *GERD:  Well controlled on omeprazole 20 mg twice daily.  Prescription sent to pharmacy. *Colorectal cancer screening: Last colonoscopy July 2014, due in 2024.   CC:  [name], [name], NP

## 2019-08-18 NOTE — Patient Instructions (Addendum)
If you are age 57 or older, your body mass index should be between 23-30. Your Body mass index is 31.48 kg/m. If this is out of the aforementioned range listed, please consider follow up with your Primary Care Provider.  If you are age 17 or younger, your body mass index should be between 19-25. Your Body mass index is 31.48 kg/m. If this is out of the aformentioned range listed, please consider follow up with your Primary Care Provider.   We have sent the following medications to your pharmacy for you to pick up at your convenience: Amitiza 24 mcg twice daily. Omeprazole 20 mg twice daily.

## 2019-08-19 MED FILL — OMEPRAZOLE 20 MG CAP: 20 | 30 days supply | Qty: 60 | Fill #0

## 2019-08-22 NOTE — Progress Notes (Signed)
Reviewed and agree with documentation and assessment and plan. K. Veena Sahiba Granholm , MD   

## 2019-09-14 DIAGNOSIS — Z23 Encounter for immunization: Secondary | ICD-10-CM | POA: Diagnosis not present

## 2019-09-20 MED FILL — OMEPRAZOLE 20 MG CAP: 20 | 90 days supply | Qty: 180 | Fill #1

## 2019-10-12 ENCOUNTER — Other Ambulatory Visit: Payer: Self-pay | Admitting: Gastroenterology

## 2019-10-12 ENCOUNTER — Telehealth: Payer: Self-pay | Admitting: Gastroenterology

## 2019-10-12 MED ORDER — LINACLOTIDE 290 MCG PO CAPS: 290.0000 ug | ORAL_CAPSULE | Freq: Every day | ORAL | 3 refills | Status: DC

## 2019-10-12 MED FILL — LINZESS 290 MCG CAPSULE: 290 | 90 days supply | Qty: 90 | Fill #0

## 2019-10-12 NOTE — Telephone Encounter (Signed)
Script sent to pharmacy.

## 2019-10-12 NOTE — Telephone Encounter (Signed)
Patient called requesting refill on Linzess

## 2019-10-19 ENCOUNTER — Ambulatory Visit (INDEPENDENT_AMBULATORY_CARE_PROVIDER_SITE_OTHER): Payer: 59 | Admitting: Obstetrics and Gynecology

## 2019-10-19 ENCOUNTER — Other Ambulatory Visit: Payer: Self-pay

## 2019-10-19 ENCOUNTER — Encounter: Payer: Self-pay | Admitting: Obstetrics and Gynecology

## 2019-10-19 VITALS — BP 122/68 | HR 74 | Ht 64.0 in | Wt 189.0 lb

## 2019-10-19 DIAGNOSIS — Z0000 Encounter for general adult medical examination without abnormal findings: Secondary | ICD-10-CM | POA: Diagnosis not present

## 2019-10-19 DIAGNOSIS — E559 Vitamin D deficiency, unspecified: Secondary | ICD-10-CM

## 2019-10-19 DIAGNOSIS — Z01419 Encounter for gynecological examination (general) (routine) without abnormal findings: Secondary | ICD-10-CM

## 2019-10-19 DIAGNOSIS — E785 Hyperlipidemia, unspecified: Secondary | ICD-10-CM | POA: Diagnosis not present

## 2019-10-19 DIAGNOSIS — Z87898 Personal history of other specified conditions: Secondary | ICD-10-CM

## 2019-10-19 NOTE — Progress Notes (Signed)
59 y.o. G42P2002 Married Other or two or more races Not Hispanic or Latino female here for annual exam.  No vaginal bleeding. No urinary c/o. Sexually active without penetration secondary to ED. Husband has MS, overall doing okay.     H/o IBS/constipation, helped with Linzess.    Patient's last menstrual period was 10/07/2014 (approximate).          Sexually active: Yes.    The current method of family planning is post menopausal status.    Exercising: Yes.    Walking, Biking, Dance  Smoker:  no  Health Maintenance: Pap:  10/12/2017 WNL NEG HPV, 10/19/2014 normal History of abnormal Pap:  no MMG:  12/01/18 Density B Bi-rads 1 neg  BMD:   None  Colonoscopy: 07-15-12 polyps, f/u in10years TDaP:  01/15/11 Gardasil: NA   reports that she has quit smoking. Her smoking use included cigarettes. She has a 26.00 pack-year smoking history. She has never used smokeless tobacco. She reports that she does not drink alcohol and does not use drugs. 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  Husband is a MD in [facility]. Son and daughter are grown, both with degree's in music.   Past Medical History:  Diagnosis Date   Anxiety    Fibroid    Gastroesophageal reflux    GERD (gastroesophageal reflux disease)    Hyperlipidemia    Hypertension    IBS (irritable bowel syndrome)    Joint pain    Osteoarthritis    both knees   Prediabetes    Prediabetes     Past Surgical History:  Procedure Laterality Date   CESAREAN SECTION     x 2   EYE SURGERY Right    KNEE ARTHROSCOPY Right    KNEE SURGERY Right    removal of large cyst-non-cancerous   TONSILLECTOMY     UMBILICAL HERNIA REPAIR     WISDOM TOOTH EXTRACTION      Current Outpatient Medications  Medication Sig Dispense Refill   linaclotide (LINZESS) 290 MCG CAPS capsule Take 1 capsule (290 mcg total) by mouth daily before breakfast. 180 capsule 3   omeprazole (PRILOSEC) 20 MG capsule Take 1 capsule (20  mg total) by mouth 2 (two) times daily before a meal. 60 capsule 11   No current facility-administered medications for this visit.    Family History  Problem Relation Age of Onset   Hyperlipidemia Mother    Hypertension Mother    Diabetes Mother    Stroke Sister    Heart disease Maternal Grandmother    Colon cancer Neg Hx   Mom with mild cognitive decline.   Review of Systems  All other systems reviewed and are negative.   Exam:   BP 122/68    Pulse 74    Ht 5\' 4"  (1.626 m)    Wt 189 lb (85.7 kg)    LMP 10/07/2014 (Approximate)    SpO2 99%    BMI 32.44 kg/m   Weight change: @WEIGHTCHANGE @ Height:   Height: 5\' 4"  (162.6 cm)  Ht Readings from Last 3 Encounters:  10/19/19 5\' 4"  (1.626 m)  08/18/19 5\' 4"  (1.626 m)  10/18/18 5\' 4"  (1.626 m)    General appearance: alert, cooperative and appears stated age Head: Normocephalic, without obvious abnormality, atraumatic Neck: no adenopathy, supple, symmetrical, trachea midline and thyroid normal to inspection and palpation Lungs: clear to auscultation bilaterally Cardiovascular: regular rate and rhythm Breasts: normal appearance, no masses or tenderness Abdomen: soft, non-tender;  non distended,  no masses,  no organomegaly Extremities: extremities normal, atraumatic, no cyanosis or edema Skin: Skin color, texture, turgor normal. No rashes or lesions Lymph nodes: Cervical, supraclavicular, and axillary nodes normal. No abnormal inguinal nodes palpated Neurologic: Grossly normal   Pelvic: External genitalia:  no lesions              Urethra:  normal appearing urethra with no masses, tenderness or lesions              Bartholins and Skenes: normal                 Vagina: normal appearing vagina with normal color and discharge, no lesions              Cervix: no lesions               Bimanual Exam:  Uterus:  normal size, contour, position, consistency, mobility, non-tender              Adnexa: no mass, fullness, tenderness                Rectovaginal: Confirms               Anus:  normal sphincter tone, no lesions  Terence Lux chaperoned for the exam.  A:  Well Woman with normal exam  H/O vit d def  H/O prediabetes  H/O elevated LDL  P:   No pap this year  Mammogram next month   Colonoscopy UTD  Discussed breast self exam  Discussed calcium and vit D intake  Return for fasting labs

## 2019-10-19 NOTE — Patient Instructions (Signed)

## 2019-10-24 ENCOUNTER — Other Ambulatory Visit (HOSPITAL_BASED_OUTPATIENT_CLINIC_OR_DEPARTMENT_OTHER): Payer: Self-pay | Admitting: Nurse Practitioner

## 2019-10-24 DIAGNOSIS — Z1231 Encounter for screening mammogram for malignant neoplasm of breast: Secondary | ICD-10-CM

## 2019-10-27 ENCOUNTER — Other Ambulatory Visit (INDEPENDENT_AMBULATORY_CARE_PROVIDER_SITE_OTHER): Payer: 59

## 2019-10-27 ENCOUNTER — Other Ambulatory Visit: Payer: Self-pay

## 2019-10-27 DIAGNOSIS — E559 Vitamin D deficiency, unspecified: Secondary | ICD-10-CM

## 2019-10-27 DIAGNOSIS — Z87898 Personal history of other specified conditions: Secondary | ICD-10-CM

## 2019-10-27 DIAGNOSIS — E785 Hyperlipidemia, unspecified: Secondary | ICD-10-CM

## 2019-10-27 DIAGNOSIS — Z0000 Encounter for general adult medical examination without abnormal findings: Secondary | ICD-10-CM

## 2019-10-31 ENCOUNTER — Telehealth: Payer: Self-pay

## 2019-10-31 NOTE — Telephone Encounter (Signed)
Spoke with patient. Advised labs from 10/27/19 were unable to be processed by [facility]. Patient would like to return to the office for recollection. Appointment scheduled for 11/03/2019 at 11:45 am. Patient is agreeable to date and time.  Routing to provider and will close encounter.

## 2019-11-01 LAB — COMPREHENSIVE METABOLIC PANEL

## 2019-11-01 LAB — VITAMIN D 25 HYDROXY (VIT D DEFICIENCY, FRACTURES)

## 2019-11-01 LAB — CBC

## 2019-11-01 LAB — LIPID PANEL

## 2019-11-01 LAB — HEMOGLOBIN A1C

## 2019-11-02 ENCOUNTER — Other Ambulatory Visit: Payer: Self-pay

## 2019-11-02 DIAGNOSIS — Z0000 Encounter for general adult medical examination without abnormal findings: Secondary | ICD-10-CM

## 2019-11-02 NOTE — Progress Notes (Signed)
Orders placed for lab redraw from 10/27/19.

## 2019-11-03 ENCOUNTER — Other Ambulatory Visit (INDEPENDENT_AMBULATORY_CARE_PROVIDER_SITE_OTHER): Payer: 59

## 2019-11-03 ENCOUNTER — Other Ambulatory Visit: Payer: Self-pay

## 2019-11-03 ENCOUNTER — Other Ambulatory Visit: Payer: 59

## 2019-11-03 DIAGNOSIS — Z0000 Encounter for general adult medical examination without abnormal findings: Secondary | ICD-10-CM

## 2019-11-04 LAB — COMPREHENSIVE METABOLIC PANEL
ALT: 20 IU/L (ref 0–32)
AST: 23 IU/L (ref 0–40)
Albumin/Globulin Ratio: 1.5 (ref 1.2–2.2)
Albumin: 4 g/dL (ref 3.8–4.9)
Alkaline Phosphatase: 61 IU/L (ref 44–121)
BUN/Creatinine Ratio: 26 — ABNORMAL HIGH (ref 9–23)
BUN: 21 mg/dL (ref 6–24)
Bilirubin Total: 0.4 mg/dL (ref 0.0–1.2)
CO2: 25 mmol/L (ref 20–29)
Calcium: 9.1 mg/dL (ref 8.7–10.2)
Chloride: 106 mmol/L (ref 96–106)
Creatinine, Ser: 0.81 mg/dL (ref 0.57–1.00)
GFR calc Af Amer: 93 mL/min/{1.73_m2} (ref >59)
GFR calc non Af Amer: 81 mL/min/{1.73_m2} (ref >59)
Globulin, Total: 2.7 g/dL (ref 1.5–4.5)
Glucose: 91 mg/dL (ref 65–99)
Potassium: 4.2 mmol/L (ref 3.5–5.2)
Sodium: 143 mmol/L (ref 134–144)
Total Protein: 6.7 g/dL (ref 6.0–8.5)

## 2019-11-04 LAB — HEMOGLOBIN A1C
Est. average glucose Bld gHb Est-mCnc: 123 mg/dL
Hgb A1c MFr Bld: 5.9 % — ABNORMAL HIGH (ref 4.8–5.6)

## 2019-11-04 LAB — CBC
Hematocrit: 36.6 % (ref 34.0–46.6)
Hemoglobin: 12 g/dL (ref 11.1–15.9)
MCH: 28.2 pg (ref 26.6–33.0)
MCHC: 32.8 g/dL (ref 31.5–35.7)
MCV: 86 fL (ref 79–97)
Platelets: 285 10*3/uL (ref 150–450)
RBC: 4.25 x10E6/uL (ref 3.77–5.28)
RDW: 14.4 % (ref 11.7–15.4)
WBC: 6.6 10*3/uL (ref 3.4–10.8)

## 2019-11-04 LAB — LIPID PANEL
Chol/HDL Ratio: 4.5 ratio — ABNORMAL HIGH (ref 0.0–4.4)
Cholesterol, Total: 233 mg/dL — ABNORMAL HIGH (ref 100–199)
HDL: 52 mg/dL (ref >39)
LDL Chol Calc (NIH): 169 mg/dL — ABNORMAL HIGH (ref 0–99)
Triglycerides: 69 mg/dL (ref 0–149)
VLDL Cholesterol Cal: 12 mg/dL (ref 5–40)

## 2019-11-04 LAB — VITAMIN D 25 HYDROXY (VIT D DEFICIENCY, FRACTURES): Vit D, 25-Hydroxy: 44.4 ng/mL (ref 30.0–100.0)

## 2019-11-11 ENCOUNTER — Ambulatory Visit: Payer: 59 | Admitting: Physician Assistant

## 2019-11-28 ENCOUNTER — Other Ambulatory Visit: Payer: Self-pay

## 2019-11-28 ENCOUNTER — Encounter (HOSPITAL_BASED_OUTPATIENT_CLINIC_OR_DEPARTMENT_OTHER): Payer: Self-pay

## 2019-11-28 ENCOUNTER — Ambulatory Visit (HOSPITAL_BASED_OUTPATIENT_CLINIC_OR_DEPARTMENT_OTHER)
Admission: RE | Admit: 2019-11-28 | Discharge: 2019-11-28 | Disposition: A | Discharge status: Home / Self Care | Payer: 59 | Source: Ambulatory Visit | Attending: Nurse Practitioner | Admitting: Nurse Practitioner

## 2019-11-28 DIAGNOSIS — Z1231 Encounter for screening mammogram for malignant neoplasm of breast: Secondary | ICD-10-CM | POA: Diagnosis not present

## 2019-12-16 ENCOUNTER — Encounter: Payer: Self-pay | Admitting: Nurse Practitioner

## 2019-12-16 MED FILL — OMEPRAZOLE 20 MG CAP: 20 | 90 days supply | Qty: 180 | Fill #2

## 2019-12-31 DIAGNOSIS — Z1152 Encounter for screening for COVID-19: Secondary | ICD-10-CM | POA: Diagnosis not present

## 2020-01-17 DIAGNOSIS — H524 Presbyopia: Secondary | ICD-10-CM | POA: Diagnosis not present

## 2020-04-03 MED FILL — OMEPRAZOLE 20 MG CAP: 20 | 90 days supply | Qty: 180 | Fill #3

## 2020-04-11 ENCOUNTER — Ambulatory Visit: Payer: 59 | Attending: Internal Medicine

## 2020-04-11 DIAGNOSIS — Z23 Encounter for immunization: Secondary | ICD-10-CM

## 2020-04-11 NOTE — Progress Notes (Signed)
   Covid-19 Vaccination Clinic  Name:  Shyla Gayheart    MRN: [id number] DOB: 1960/06/18  04/11/2020  Ms. Delima was observed post Covid-19 immunization for 15 minutes without incident. She was provided with Vaccine Information Sheet and instruction to access the V-Safe system.   Ms. Burggraf was instructed to call 911 with any severe reactions post vaccine: Marland Kitchen Difficulty breathing  . Swelling of face and throat  . A fast heartbeat  . A bad rash all over body  . Dizziness and weakness   Immunizations Administered    Name Date Dose VIS Date Route   PFIZER Comrnaty(Gray TOP) Covid-19 Vaccine 04/11/2020  9:55 AM 0.3 mL 12/15/2019 Intramuscular   Manufacturer: [place]   Lot: [id number]   [place]: [phone]

## 2020-04-17 ENCOUNTER — Other Ambulatory Visit (HOSPITAL_BASED_OUTPATIENT_CLINIC_OR_DEPARTMENT_OTHER): Payer: Self-pay

## 2020-04-17 MED ORDER — PFIZER-BIONT COVID-19 VAC-TRIS 30 MCG/0.3ML IM SUSP
INTRAMUSCULAR | 0 refills | Status: DC
  Filled 2020-04-17: qty 0.3, 1d supply, fill #0

## 2020-05-04 ENCOUNTER — Other Ambulatory Visit (HOSPITAL_BASED_OUTPATIENT_CLINIC_OR_DEPARTMENT_OTHER): Payer: Self-pay

## 2020-05-04 MED FILL — Linaclotide Cap 290 MCG: ORAL | 90 days supply | Qty: 90 | Fill #0 | Status: AC

## 2020-06-28 ENCOUNTER — Ambulatory Visit: Payer: 59 | Admitting: Nurse Practitioner

## 2020-07-16 ENCOUNTER — Other Ambulatory Visit (HOSPITAL_BASED_OUTPATIENT_CLINIC_OR_DEPARTMENT_OTHER): Payer: Self-pay

## 2020-07-16 MED FILL — Omeprazole Cap Delayed Release 20 MG: ORAL | 60 days supply | Qty: 120 | Fill #0 | Status: AC

## 2020-07-18 ENCOUNTER — Other Ambulatory Visit (HOSPITAL_BASED_OUTPATIENT_CLINIC_OR_DEPARTMENT_OTHER): Payer: Self-pay

## 2020-07-19 ENCOUNTER — Other Ambulatory Visit (HOSPITAL_COMMUNITY): Payer: Self-pay

## 2020-08-23 ENCOUNTER — Ambulatory Visit: Payer: 59 | Admitting: Nurse Practitioner

## 2020-09-24 ENCOUNTER — Other Ambulatory Visit: Payer: Self-pay | Admitting: Gastroenterology

## 2020-09-24 ENCOUNTER — Other Ambulatory Visit (HOSPITAL_BASED_OUTPATIENT_CLINIC_OR_DEPARTMENT_OTHER): Payer: Self-pay

## 2020-09-24 MED ORDER — OMEPRAZOLE 20 MG PO CPDR
DELAYED_RELEASE_CAPSULE | Freq: Two times a day (BID) | ORAL | 0 refills | Status: DC
  Filled 2020-09-24: qty 180, 90d supply, fill #0

## 2020-09-24 NOTE — Telephone Encounter (Signed)
Patient needs office visit.  

## 2020-10-03 ENCOUNTER — Other Ambulatory Visit (HOSPITAL_COMMUNITY): Payer: Self-pay

## 2020-10-05 MED FILL — Linaclotide Cap 290 MCG: ORAL | 90 days supply | Qty: 90 | Fill #1 | Status: AC

## 2020-10-08 ENCOUNTER — Other Ambulatory Visit (HOSPITAL_BASED_OUTPATIENT_CLINIC_OR_DEPARTMENT_OTHER): Payer: Self-pay

## 2020-10-15 ENCOUNTER — Other Ambulatory Visit (HOSPITAL_BASED_OUTPATIENT_CLINIC_OR_DEPARTMENT_OTHER): Payer: Self-pay

## 2020-10-25 ENCOUNTER — Ambulatory Visit: Payer: 59 | Admitting: Obstetrics and Gynecology

## 2020-10-30 ENCOUNTER — Ambulatory Visit: Payer: 59 | Attending: Internal Medicine

## 2020-10-30 DIAGNOSIS — Z23 Encounter for immunization: Secondary | ICD-10-CM

## 2020-10-30 NOTE — Progress Notes (Signed)
   Covid-19 Vaccination Clinic  Name:  Onyx Edgley    MRN: [id number] DOB: 1960-12-19  10/30/2020  Ms. Bines was observed post Covid-19 immunization for 15 minutes without incident. She was provided with Vaccine Information Sheet and instruction to access the V-Safe system.   Ms. Creason was instructed to call 911 with any severe reactions post vaccine: Difficulty breathing  Swelling of face and throat  A fast heartbeat  A bad rash all over body  Dizziness and weakness   Immunizations Administered     Name Date Dose VIS Date Route   Pfizer Covid-19 Vaccine Bivalent Booster 10/30/2020 12:16 PM 0.3 mL 09/05/2020 Intramuscular   Manufacturer: [place]   Lot: [id number]   [place]: [phone]

## 2020-11-18 IMAGING — MG DIGITAL SCREENING BILAT W/ TOMO W/ CAD
6 of 10 series · 6 of 30 positions shown · non-contrast
Comparison: Previous exam(s).

CLINICAL DATA: Screening.

EXAM:
DIGITAL SCREENING BILATERAL MAMMOGRAM WITH TOMO AND CAD

[L CC synth-2D (1 of 2)]
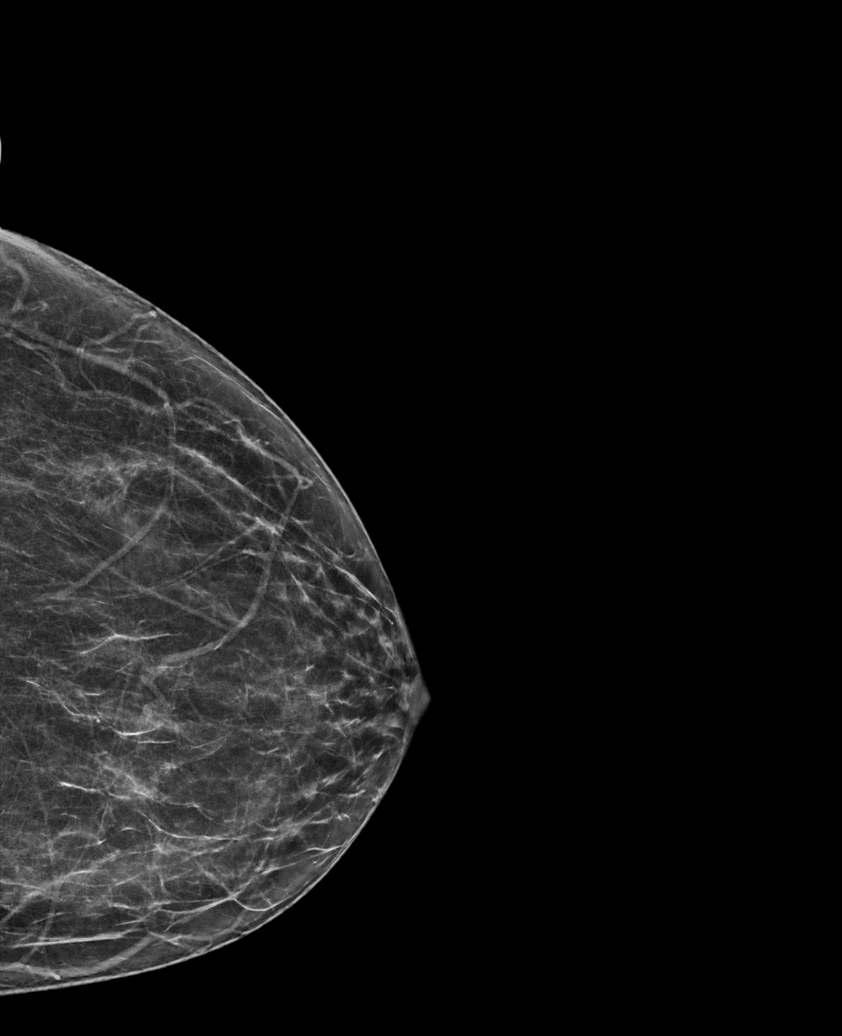

[R MLO synth-2D]
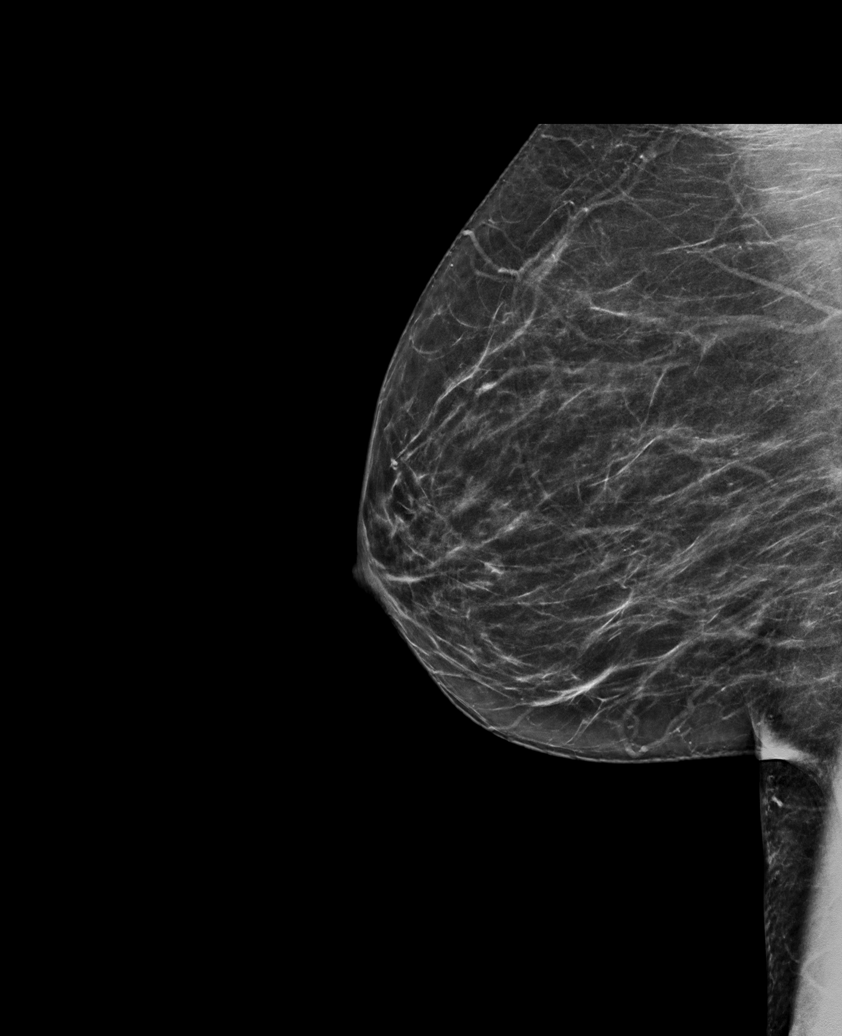

[L CC synth-2D (2 of 2)]
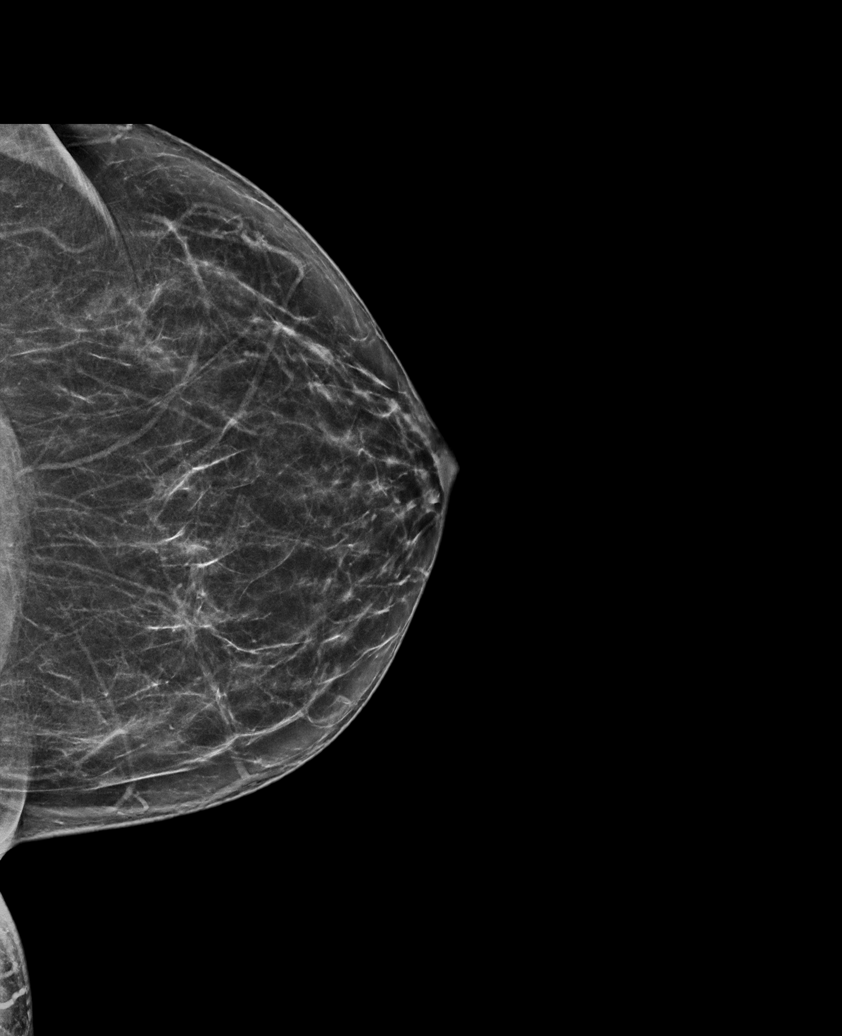

[L MLO synth-2D]
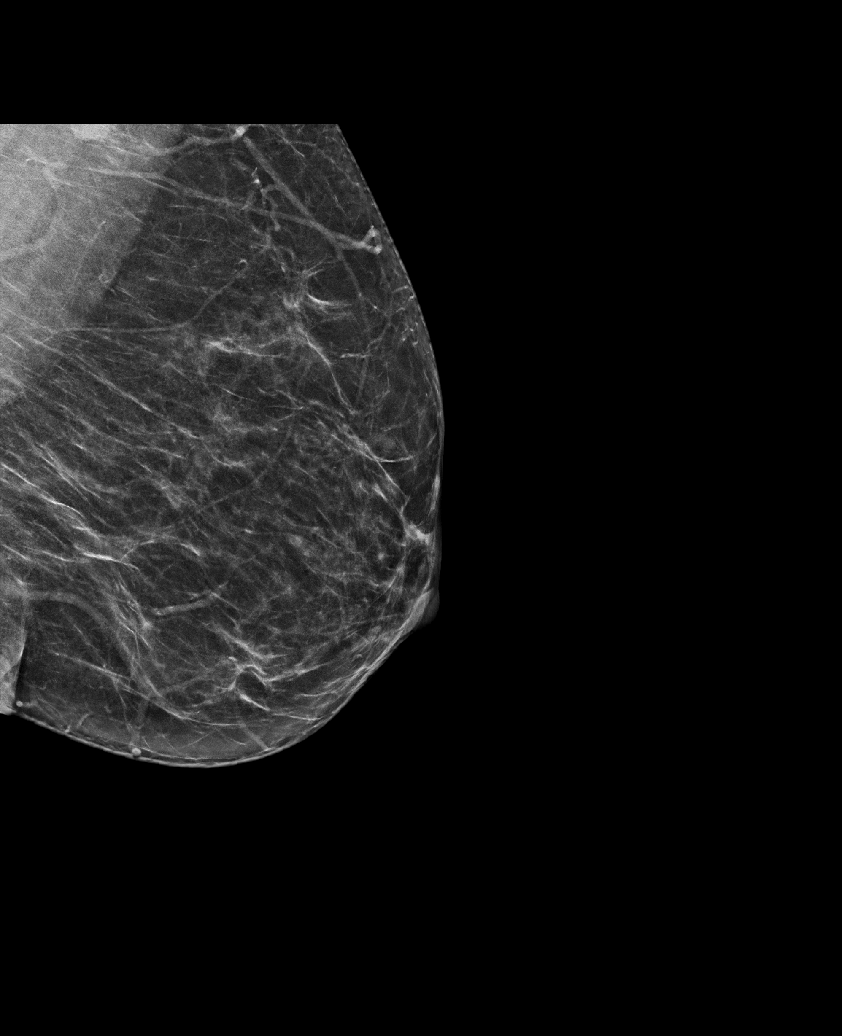

[R CC synth-2D]
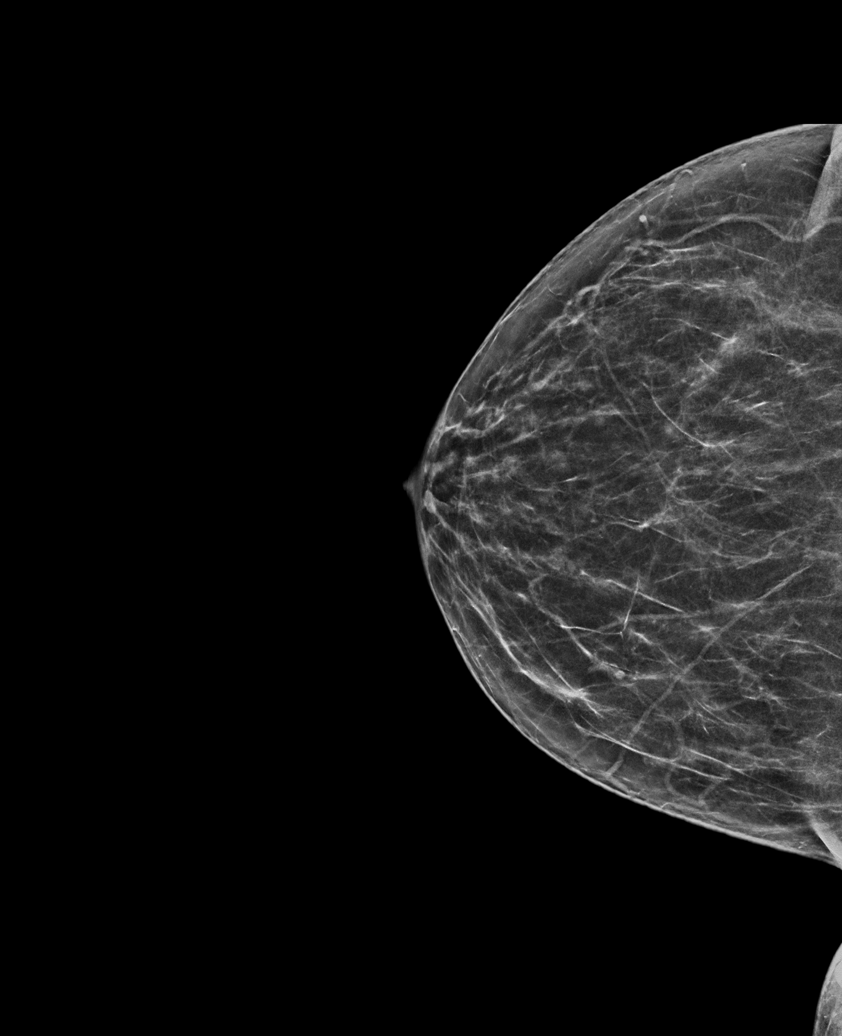

[R MLO tomo · tomo slice 33/66.0]
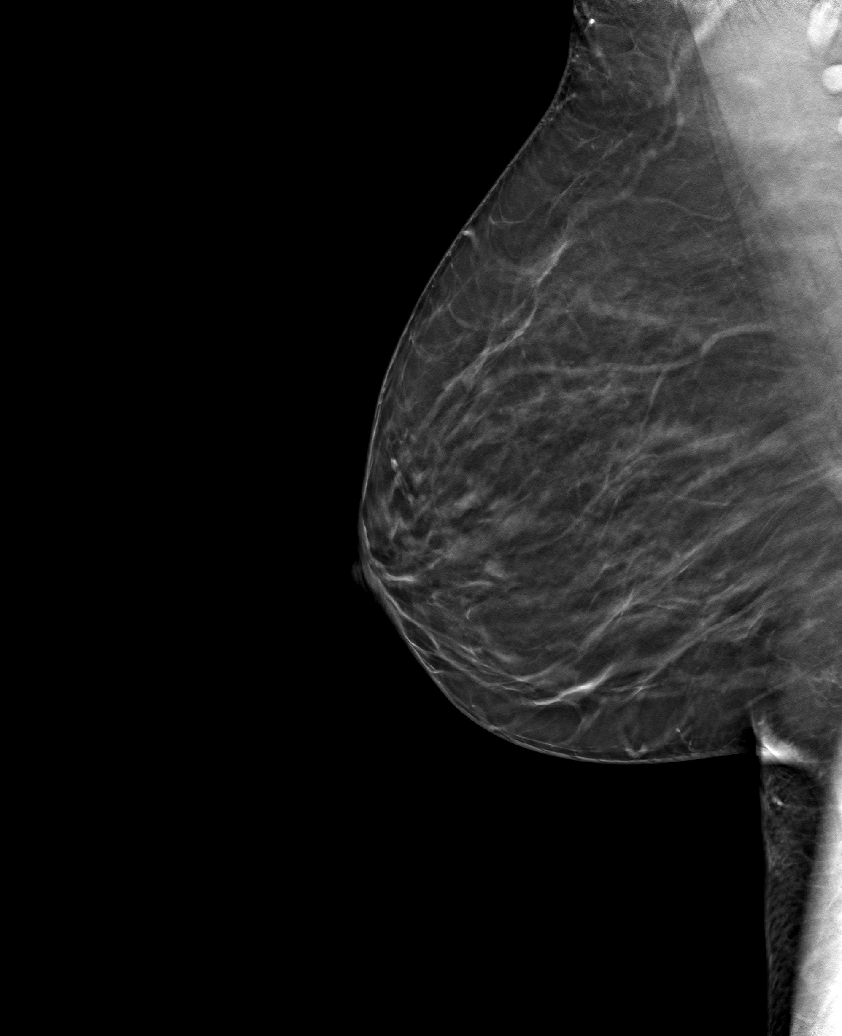

[6 of 30 positions shown; findings below may reference images not displayed]

ACR Breast Density Category b: There are scattered areas of
fibroglandular density.
FINDINGS: There are no findings suspicious for malignancy. Images were
processed with CAD.
IMPRESSION: No mammographic evidence of malignancy. A result letter of this
screening mammogram will be mailed directly to the patient.

RECOMMENDATION:
Screening mammogram in one year. (Code:[id number])

BI-RADS CATEGORY  1: Negative.

## 2020-11-23 ENCOUNTER — Other Ambulatory Visit (HOSPITAL_BASED_OUTPATIENT_CLINIC_OR_DEPARTMENT_OTHER): Payer: Self-pay

## 2020-11-23 MED ORDER — PFIZER COVID-19 VAC BIVALENT 30 MCG/0.3ML IM SUSP
INTRAMUSCULAR | 0 refills | Status: DC
  Filled 2020-11-23: qty 0.3, 1d supply, fill #0

## 2020-12-24 ENCOUNTER — Other Ambulatory Visit: Payer: Self-pay | Admitting: Gastroenterology

## 2020-12-24 ENCOUNTER — Other Ambulatory Visit (HOSPITAL_BASED_OUTPATIENT_CLINIC_OR_DEPARTMENT_OTHER): Payer: Self-pay

## 2020-12-25 ENCOUNTER — Other Ambulatory Visit (HOSPITAL_BASED_OUTPATIENT_CLINIC_OR_DEPARTMENT_OTHER): Payer: Self-pay

## 2020-12-27 ENCOUNTER — Other Ambulatory Visit (HOSPITAL_BASED_OUTPATIENT_CLINIC_OR_DEPARTMENT_OTHER): Payer: Self-pay

## 2020-12-28 ENCOUNTER — Telehealth (HOSPITAL_BASED_OUTPATIENT_CLINIC_OR_DEPARTMENT_OTHER): Payer: Self-pay

## 2020-12-28 ENCOUNTER — Telehealth: Payer: Self-pay | Admitting: *Deleted

## 2020-12-28 DIAGNOSIS — Z1231 Encounter for screening mammogram for malignant neoplasm of breast: Secondary | ICD-10-CM

## 2020-12-28 NOTE — Telephone Encounter (Signed)
Patient called stating [facility] needs order for mammogram. Order placed.

## 2021-01-01 NOTE — Telephone Encounter (Signed)
Patient scheduled on 01/17/21

## 2021-01-02 ENCOUNTER — Ambulatory Visit: Payer: 59 | Admitting: Obstetrics and Gynecology

## 2021-01-17 ENCOUNTER — Ambulatory Visit (HOSPITAL_BASED_OUTPATIENT_CLINIC_OR_DEPARTMENT_OTHER)
Admission: RE | Admit: 2021-01-17 | Discharge: 2021-01-17 | Disposition: A | Discharge status: Home / Self Care | Payer: 59 | Source: Ambulatory Visit | Attending: Obstetrics and Gynecology | Admitting: Obstetrics and Gynecology

## 2021-01-17 ENCOUNTER — Ambulatory Visit: Payer: 59 | Admitting: Nurse Practitioner

## 2021-01-17 ENCOUNTER — Encounter (HOSPITAL_BASED_OUTPATIENT_CLINIC_OR_DEPARTMENT_OTHER): Payer: Self-pay

## 2021-01-17 ENCOUNTER — Other Ambulatory Visit: Payer: Self-pay

## 2021-01-17 DIAGNOSIS — Z1231 Encounter for screening mammogram for malignant neoplasm of breast: Secondary | ICD-10-CM | POA: Insufficient documentation

## 2021-01-18 ENCOUNTER — Other Ambulatory Visit: Payer: Self-pay | Admitting: Obstetrics and Gynecology

## 2021-01-18 DIAGNOSIS — R928 Other abnormal and inconclusive findings on diagnostic imaging of breast: Secondary | ICD-10-CM

## 2021-01-22 NOTE — Progress Notes (Signed)
[place] Phone: [phone] Subjective:   Fontaine No, am serving as a scribe for Dr. Hulan Saas.This visit occurred during the SARS-CoV-2 public health emergency.  Safety protocols were in place, including screening questions prior to the visit, additional usage of staff PPE, and extensive cleaning of exam room while observing appropriate contact time as indicated for disinfecting solutions.  I'm seeing this patient by the request  of:  [name], [name], NP  CC: foot and ankle pain   GBT:[id number]  Fredonia Feely is a 61 y.o. female coming in with complaint of left foot and ankle pain. Last seen in 2019. Is using [employer] Support Orthotics. Patient walks 3 miles a day and has been noticing pain in medial aspect of ankle, distal to medial malleolus. Pain can be sharp. Pain with DF. No change in route she is walking. Using Extra Strength Tylenol daily.      Past Medical History:  Diagnosis Date   Anxiety    Fibroid    Gastroesophageal reflux    GERD (gastroesophageal reflux disease)    Hyperlipidemia    Hypertension    IBS (irritable bowel syndrome)    Joint pain    Osteoarthritis    both knees   Prediabetes    Prediabetes    Past Surgical History:  Procedure Laterality Date   CESAREAN SECTION     x 2   EYE SURGERY Right    KNEE ARTHROSCOPY Right    KNEE SURGERY Right    removal of large cyst-non-cancerous   TONSILLECTOMY     UMBILICAL HERNIA REPAIR     WISDOM TOOTH EXTRACTION     Social History   Socioeconomic History   Marital status: Married    Spouse name: Kelita Wallis   Number of children: 2   Years of education: Not on file   Highest education level: Not on file  Occupational History   Occupation: Therapist, 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   Tobacco Use   Smoking status: Former    Packs/day: 1.00    Years: 26.00    Pack years: 26.00    Types: Cigarettes   Smokeless tobacco: Never  Vaping Use    Vaping Use: Never used  Substance and Sexual Activity   Alcohol use: No   Drug use: No   Sexual activity: Yes    Partners: Male    Birth control/protection: Post-menopausal  Other Topics Concern   Not on file  Social History Narrative   Not on file   Social Determinants of Health   Financial Resource Strain: Not on file  Food Insecurity: Not on file  Transportation Needs: Not on file  Physical Activity: Not on file  Stress: Not on file  Social Connections: Not on file   Allergies  Allergen Reactions   Penicillins Anaphylaxis and Rash   Family History  Problem Relation Age of Onset   Hyperlipidemia Mother    Hypertension Mother    Diabetes Mother    Stroke Sister    Heart disease Maternal Grandmother    Colon cancer Neg Hx          Current Outpatient Medications (Other):    COVID-19 mRNA bivalent vaccine, Pfizer, (PFIZER COVID-19 VAC BIVALENT) injection, Inject into the muscle.   COVID-19 mRNA Vac-TriS, Pfizer, (PFIZER-BIONT COVID-19 VAC-TRIS) SUSP injection, Inject into the muscle.   omeprazole (PRILOSEC) 20 MG capsule, TAKE 1 CAPSULE (20 MG TOTAL) BY MOUTH 2 (TWO) TIMES DAILY  BEFORE A MEAL.   Vitamin D, Ergocalciferol, (DRISDOL) 1.25 MG (50000 UNIT) CAPS capsule, Take 1 capsule (50,000 Units total) by mouth every 7 (seven) days.   linaclotide (LINZESS) 290 MCG CAPS capsule, TAKE 1 CAPSULE (290 MCG TOTAL) BY MOUTH DAILY BEFORE BREAKFAST.   Reviewed prior external information including notes and imaging from  primary care provider As well as notes that were available from care everywhere and other healthcare systems.  Past medical history, social, surgical and family history all reviewed in electronic medical record.  No pertanent information unless stated regarding to the chief complaint.   Review of Systems:  No headache, visual changes, nausea, vomiting, diarrhea, constipation, dizziness, abdominal pain, skin rash, fevers, chills, night sweats, weight loss,  swollen lymph nodes, body aches, joint swelling, chest pain, shortness of breath, mood changes. POSITIVE muscle aches  Objective  Blood pressure 120/86, pulse (!) 59, height 5\' 4"  (1.626 m), weight 190 lb (86.2 kg), last menstrual period 10/07/2014, SpO2 97 %.   General: No apparent distress alert and oriented x3 mood and affect normal, dressed appropriately.  HEENT: Pupils equal, extraocular movements intact  Respiratory: Patient's speak in full sentences and does not appear short of breath  Cardiovascular: No lower extremity edema, non tender, no erythema  Gait normal with good balance and coordination.  MSK: Left exam shows the patient is severely tender to palpation over the medial mall illness.  Not as much posteriorly.  Patient has very mild discomfort over the navicular bone itself. Ankle mortise has good range of motion but lacks last 10 degrees of extension.  Neurovascularly intact distally.     Limited muscular skeletal ultrasound was performed and interpreted by Hulan Saas, M    Limited ultrasound of patient's right ankle shows the patient does have a cortical irregularity noted of the medial malleolus on the left ankle.  This is where patient is severely tender to palpation.  Compressible vessel noted.  Patient does have some narrowing of the ankle mortise as well of the anterior aspect of the medial aspect.  Patient's navicular bone noted does appear to be unremarkable.  Posterior tibialis tendon does not have any significant signs of tearing noted. Impression: Questionable cortical irregularity of the medial malleolus.   Impression and Recommendations:     The above documentation has been reviewed and is accurate and complete Lyndal Pulley, DO

## 2021-01-23 ENCOUNTER — Encounter: Payer: Self-pay | Admitting: Family Medicine

## 2021-01-23 ENCOUNTER — Ambulatory Visit (INDEPENDENT_AMBULATORY_CARE_PROVIDER_SITE_OTHER): Payer: 59

## 2021-01-23 ENCOUNTER — Other Ambulatory Visit (HOSPITAL_BASED_OUTPATIENT_CLINIC_OR_DEPARTMENT_OTHER): Payer: Self-pay

## 2021-01-23 ENCOUNTER — Other Ambulatory Visit: Payer: Self-pay

## 2021-01-23 ENCOUNTER — Ambulatory Visit: Payer: Self-pay

## 2021-01-23 ENCOUNTER — Ambulatory Visit: Payer: 59 | Admitting: Family Medicine

## 2021-01-23 VITALS — BP 120/86 | HR 59 | Ht 64.0 in | Wt 190.0 lb

## 2021-01-23 DIAGNOSIS — M79672 Pain in left foot: Secondary | ICD-10-CM

## 2021-01-23 DIAGNOSIS — M25572 Pain in left ankle and joints of left foot: Secondary | ICD-10-CM | POA: Insufficient documentation

## 2021-01-23 DIAGNOSIS — R600 Localized edema: Secondary | ICD-10-CM | POA: Diagnosis not present

## 2021-01-23 MED ORDER — VITAMIN D (ERGOCALCIFEROL) 1.25 MG (50000 UNIT) PO CAPS
50000.0000 [IU] | ORAL_CAPSULE | ORAL | 0 refills | Status: DC
  Filled 2021-01-23: qty 12, 84d supply, fill #0

## 2021-01-23 NOTE — Patient Instructions (Addendum)
Aircast Once weekly Vit D Stick to only biking for now HOKA recovery sandals in house Xray today Start ankle exercises in 10 days See me again in 4 weeks

## 2021-01-23 NOTE — Assessment & Plan Note (Signed)
Patient does have what appears to be a possible insufficiency fracture of the medial malleolus.  We will get x-rays to further evaluate.  I do not see any significant displacement.  Once weekly vitamin D given today to see how patient heals, Aircast given.  Discussed which activities to do and do more air low impact exercises.  Follow-up again in 4 weeks and we will advance accordingly.  Mild home exercises given today as well.

## 2021-01-30 ENCOUNTER — Ambulatory Visit: Payer: 59 | Admitting: Physician Assistant

## 2021-01-30 ENCOUNTER — Other Ambulatory Visit (HOSPITAL_BASED_OUTPATIENT_CLINIC_OR_DEPARTMENT_OTHER): Payer: Self-pay

## 2021-01-30 ENCOUNTER — Encounter: Payer: Self-pay | Admitting: Physician Assistant

## 2021-01-30 VITALS — BP 138/100 | HR 68 | Ht 64.0 in | Wt 191.2 lb

## 2021-01-30 DIAGNOSIS — K581 Irritable bowel syndrome with constipation: Secondary | ICD-10-CM | POA: Diagnosis not present

## 2021-01-30 DIAGNOSIS — K219 Gastro-esophageal reflux disease without esophagitis: Secondary | ICD-10-CM | POA: Diagnosis not present

## 2021-01-30 MED ORDER — LINACLOTIDE 290 MCG PO CAPS
290.0000 ug | ORAL_CAPSULE | Freq: Every day | ORAL | 3 refills | Status: DC
  Filled 2021-01-30: qty 90, 90d supply, fill #0
  Filled 2021-06-01: qty 90, 90d supply, fill #1
  Filled 2021-08-28: qty 90, 90d supply, fill #2
  Filled 2021-11-30: qty 90, 90d supply, fill #3

## 2021-01-30 MED ORDER — OMEPRAZOLE 20 MG PO CPDR
DELAYED_RELEASE_CAPSULE | Freq: Two times a day (BID) | ORAL | 3 refills | Status: DC
  Filled 2021-01-30: qty 180, 90d supply, fill #0
  Filled 2021-07-11: qty 180, 90d supply, fill #1
  Filled 2021-10-26: qty 180, 90d supply, fill #2
  Filled 2022-01-21 (×2): qty 180, 90d supply, fill #3

## 2021-01-30 NOTE — Progress Notes (Signed)
Chief Complaint: Refill of Omeprazole and Linzess  HPI:    Elizabeth Brooks is a 61 year old African-American female past medical history of reflux and anxiety as well as IBS, known to Dr. Silverio Decamp, who presents clinic today to discuss refill of her Omeprazole and Linzess.    07/2012 colonoscopy with a 3 mm polyp removed from the ascending colon, path consistent with lymphoid aggregate no adenomatous changes.  Also small internal hemorrhoids.  Repeat recommended 10 years.    08/18/2019 patient seen in clinic by Alonza Bogus.  At that time was wanting a refill of her Omeprazole.  She was taking it 20 mg twice a day which worked well.  She also wanted to discuss her constipation again.  She was previously on Linzess to 90 which was working well for her.  However she felt like it stopped working recently.  At that time discussed that she is started Phentermine since January which was thought to be contributing to her worsening constipation.  She wanted to retry Amitiza 24 mcg twice daily.  Her Omeprazole was refilled.  Discussed that she would be due for a colonoscopy in July 2024.    08/23/2019 patient called back and stated that she wanted to stay on the [place].    Today, the patient presents to clinic and tells me as long as she uses her Omeprazole 20 twice daily and her Linzess 290 daily she does very well.  Everything is under control.  She has daily soft formed bowel movements, no blood abdominal pain and no heartburn or reflux.  Does tell me that she knows if she misses either of these medicines she will have symptoms.    We discussed her plants.  She has become an enthusiast since [place] and now bulbs.    Denies fever, chills, weight loss, change in bowel habits or blood in her stool.  Past Medical History:  Diagnosis Date   Anxiety    Fibroid    Gastroesophageal reflux    GERD (gastroesophageal reflux disease)    Hyperlipidemia    Hypertension    IBS (irritable bowel  syndrome)    Joint pain    Osteoarthritis    both knees   Prediabetes    Prediabetes     Past Surgical History:  Procedure Laterality Date   CESAREAN SECTION     x 2   EYE SURGERY Right    KNEE ARTHROSCOPY Right    KNEE SURGERY Right    removal of large cyst-non-cancerous   TONSILLECTOMY     UMBILICAL HERNIA REPAIR     WISDOM TOOTH EXTRACTION      Current Outpatient Medications  Medication Sig Dispense Refill   COVID-19 mRNA bivalent vaccine, Pfizer, (PFIZER COVID-19 VAC BIVALENT) injection Inject into the muscle. 0.3 mL 0   COVID-19 mRNA Vac-TriS, Pfizer, (PFIZER-BIONT COVID-19 VAC-TRIS) SUSP injection Inject into the muscle. 0.3 mL 0   linaclotide (LINZESS) 290 MCG CAPS capsule TAKE 1 CAPSULE (290 MCG TOTAL) BY MOUTH DAILY BEFORE BREAKFAST. 180 capsule 3   omeprazole (PRILOSEC) 20 MG capsule TAKE 1 CAPSULE (20 MG TOTAL) BY MOUTH 2 (TWO) TIMES DAILY BEFORE A MEAL. 180 capsule 0   Vitamin D, Ergocalciferol, (DRISDOL) 1.25 MG (50000 UNIT) CAPS capsule Take 1 capsule (50,000 Units total) by mouth every 7 (seven) days. 12 capsule 0   No current facility-administered medications for this visit.    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   Tobacco Use   Smoking status: Former    Packs/day: 1.00    Years: 26.00    Pack years: 26.00    Types: Cigarettes   Smokeless tobacco: Never  Vaping Use   Vaping Use: Never used  Substance and Sexual  Activity   Alcohol use: No   Drug use: No   Sexual activity: Yes    Partners: Male    Birth control/protection: Post-menopausal  Other Topics Concern   Not on file  Social History Narrative   Not on file   Social Determinants of Health   Financial Resource Strain: Not on file  Food Insecurity: Not on file  Transportation Needs: Not on file  Physical Activity: Not on file  Stress: Not on file  Social Connections: Not on file  Intimate Partner Violence: Not on file    Review of Systems:    Constitutional: No weight loss, fever or chills Cardiovascular: No chest pain Respiratory: No SOB  Gastrointestinal: See HPI and otherwise negative   Physical Exam:  Vital signs: BP (!) 138/100 (BP Location: Left Arm, Patient Position: Sitting, Cuff Size: Normal)    Pulse 68    Ht 5\' 4"  (1.626 m) Comment: height measured without shoes   Wt 191 lb 4 oz (86.8 kg)    LMP 10/07/2014 (Approximate)    BMI 32.83 kg/m   Constitutional:   Pleasant AA female appears to be in NAD, Well developed, Well nourished, alert and cooperative  Respiratory: Respirations even and unlabored. Lungs clear to auscultation bilaterally.   No wheezes, crackles, or rhonchi.  Cardiovascular: Normal S1, S2. No MRG. Regular rate and rhythm. No peripheral edema, cyanosis or pallor.  Gastrointestinal:  Soft, nondistended, nontender. No rebound or guarding. Normal bowel sounds. No appreciable masses or hepatomegaly. Rectal:  Not performed.  Psychiatric: Oriented to person, place and time. Demonstrates good judgement and reason without abnormal affect or behaviors.  RELEVANT LABS AND IMAGING: CBC    Component Value Date/Time   WBC 6.6 11/03/2019 1330   WBC 7.7 10/08/2016 0939   RBC 4.25 11/03/2019 1330   RBC 4.71 10/08/2016 0939   HGB 12.0 11/03/2019 1330   HCT 36.6 11/03/2019 1330   PLT 285 11/03/2019 1330   MCV 86 11/03/2019 1330   MCH 28.2 11/03/2019 1330   MCHC 32.8 11/03/2019 1330   MCHC 32.3 10/08/2016 0939    RDW 14.4 11/03/2019 1330   LYMPHSABS 2.4 02/16/2018 1030   EOSABS 0.2 02/16/2018 1030   BASOSABS 0.0 02/16/2018 1030    CMP     Component Value Date/Time   NA 143 11/03/2019 1330   K 4.2 11/03/2019 1330   CL 106 11/03/2019 1330   CO2 25 11/03/2019 1330   GLUCOSE 91 11/03/2019 1330   GLUCOSE 99 10/08/2016 0939   BUN 21 11/03/2019 1330   CREATININE 0.81 11/03/2019 1330   CALCIUM 9.1 11/03/2019 1330   PROT  6.7 11/03/2019 1330   ALBUMIN 4.0 11/03/2019 1330   AST 23 11/03/2019 1330   ALT 20 11/03/2019 1330   ALKPHOS 61 11/03/2019 1330   BILITOT 0.4 11/03/2019 1330   GFRNONAA 81 11/03/2019 1330   GFRAA 93 11/03/2019 1330    Assessment: 1.  GERD: Controlled on Omeprazole 20 mg twice daily 2.  IBS-C: Controlled on Linzess 290 mcg daily  Plan: 1.  Refilled Omeprazole 20 mg twice daily, 30-60 minutes before breakfast and dinner.  #180 with 3 refills 2.  Refilled Linzess 290 mcg daily, 30 minutes before breakfast #90 with 3 refills 3.  Patient return to clinic in a year or sooner if necessary.  Ellouise Newer, PA-C [facility] 01/30/2021, 3:02 PM  Cc: Elizabeth Buffy, NP

## 2021-01-30 NOTE — Progress Notes (Signed)
Reviewed and agree with documentation and assessment and plan. K. Veena Raesha Coonrod , MD   

## 2021-01-30 NOTE — Patient Instructions (Signed)
We have sent the following medications to your pharmacy for you to pick up at your convenience: Linzess 290 mcg daily before breakfast. Omeprazole 20 mg twice daily 30-60 breakfast and dinner.   If you are age 59 or older, your body mass index should be between 23-30. Your Body mass index is 32.83 kg/m. If this is out of the aforementioned range listed, please consider follow up with your Primary Care Provider.  If you are age 44 or younger, your body mass index should be between 19-25. Your Body mass index is 32.83 kg/m. If this is out of the aformentioned range listed, please consider follow up with your Primary Care Provider.   ________________________________________________________  The [facility] providers would like to encourage you to use [facility] to communicate with providers for non-urgent requests or questions.  Due to long hold times on the telephone, sending your provider a message by [facility] may be a faster and more efficient way to get a response.  Please allow 48 business hours for a response.  Please remember that this is for non-urgent requests.  _______________________________________________________

## 2021-02-19 NOTE — Progress Notes (Signed)
[facility] [place] Phone: [phone] Subjective:   Fontaine No, am serving as a scribe for Dr. Hulan Saas.  This visit occurred during the SARS-CoV-2 public health emergency.  Safety protocols were in place, including screening questions prior to the visit, additional usage of staff PPE, and extensive cleaning of exam room while observing appropriate contact time as indicated for disinfecting solutions.  I'm seeing this patient by the request  of:  [name], [name], NP  CC: left ankle and foot pain   IOX:[id number]  01/23/2021 Patient does have what appears to be a possible insufficiency fracture of the medial malleolus.  We will get x-rays to further evaluate.  I do not see any significant displacement.  Once weekly vitamin D given today to see how patient heals, Aircast given.  Discussed which activities to do and do more air low impact exercises.  Follow-up again in 4 weeks and we will advance accordingly.  Mild home exercises given today as well.  Updated 02/20/2021 Thalia Turkington is a 61 y.o. female coming in with complaint of left ankle and foot pain. Patient states that her pain is slowly improving. Patient always notes constant soreness over medial malleolus. Unable to wear aircast as it caused blister on foot.   Xray IMPRESSION: 1. Soft tissue edema laterally. 2. Moderate plantar calcaneal spur. Mild talonavicular osteoarthritis. 3. Well corticated densities distal to the medial malleolus may represent sequela of remote prior injury or accessory ossicles.     Past Medical History:  Diagnosis Date   Anxiety    Fibroid    Gastroesophageal reflux    GERD (gastroesophageal reflux disease)    Hyperlipidemia    Hypertension    IBS (irritable bowel syndrome)    Joint pain    Osteoarthritis    both knees   Prediabetes    Prediabetes    Past Surgical History:  Procedure Laterality Date   CESAREAN  SECTION     x 2   EYE SURGERY Right    KNEE ARTHROSCOPY Right    KNEE SURGERY Right    removal of large cyst-non-cancerous   TONSILLECTOMY     UMBILICAL HERNIA REPAIR     WISDOM TOOTH EXTRACTION     Social History   Socioeconomic History   Marital status: Married    Spouse name: Kyndell Zeiser   Number of children: 2   Years of education: Not on file   Highest education level: Not on file  Occupational History   Occupation: Therapist, 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   Tobacco Use   Smoking status: Former    Packs/day: 1.00    Years: 26.00    Pack years: 26.00    Types: Cigarettes   Smokeless tobacco: Never  Vaping Use   Vaping Use: Never used  Substance and Sexual Activity   Alcohol use: No   Drug use: No   Sexual activity: Yes    Partners: Male    Birth control/protection: Post-menopausal  Other Topics Concern   Not on file  Social History Narrative   Not on file   Social Determinants of Health   Financial Resource Strain: Not on file  Food Insecurity: Not on file  Transportation Needs: Not on file  Physical Activity: Not on file  Stress: Not on file  Social Connections: Not on file   Allergies  Allergen Reactions   Penicillins Anaphylaxis and Rash   Family History  Problem Relation Age of Onset  Hyperlipidemia Mother    Hypertension Mother    Diabetes Mother    Stroke Sister    Heart disease Maternal Grandmother    Colon cancer Neg Hx          Current Outpatient Medications (Other):    linaclotide (LINZESS) 290 MCG CAPS capsule, Take 1 capsule (290 mcg total) by mouth daily before breakfast.   omeprazole (PRILOSEC) 20 MG capsule, TAKE 1 CAPSULE (20 MG TOTAL) BY MOUTH 2 (TWO) TIMES DAILY BEFORE A MEAL.   Vitamin D, Ergocalciferol, (DRISDOL) 1.25 MG (50000 UNIT) CAPS capsule, Take 1 capsule (50,000 Units total) by mouth every 7 (seven) days.   Reviewed prior external information including notes and imaging from  primary care provider As well as notes that were  available from care everywhere and other healthcare systems.  Past medical history, social, surgical and family history all reviewed in electronic medical record.  No pertanent information unless stated regarding to the chief complaint.   Review of Systems:  No headache, visual changes, nausea, vomiting, diarrhea, constipation, dizziness, abdominal pain, skin rash, fevers, chills, night sweats, weight loss, swollen lymph nodes, body aches, joint swelling, chest pain, shortness of breath, mood changes. POSITIVE muscle aches  Objective  Blood pressure 114/86, pulse 70, height 5\' 4"  (1.626 m), weight 193 lb (87.5 kg), last menstrual period 10/07/2014, SpO2 96 %.   General: No apparent distress alert and oriented x3 mood and affect normal, dressed appropriately.  HEENT: Pupils equal, extraocular movements intact  Respiratory: Patient's speak in full sentences and does not appear short of breath  Cardiovascular: No lower extremity edema, non tender, no erythema  Gait antalgic  MSK: overpronation of the foot severe tenderness over navicular pain  Tender over the ptt    Limited muscular skeletal ultrasound was performed and interpreted by Hulan Saas, M  Patient's medial malleolus does have calcific changes noted that is quite severe.  Seems to be causing some impingement noted.  Patient does not have any new cortical defect noted of the medial malleolus.  Patient does have some possible cortical irregularities noted of the navicular bone on the dorsal aspect. Impression: Chronic changes in the area of the medial ankle mortise as well as the navicular bone.    Impression and Recommendations:     The above documentation has been reviewed and is accurate and complete Lyndal Pulley, DO

## 2021-02-20 ENCOUNTER — Other Ambulatory Visit: Payer: Self-pay

## 2021-02-20 ENCOUNTER — Ambulatory Visit: Payer: Self-pay

## 2021-02-20 ENCOUNTER — Ambulatory Visit: Payer: 59 | Admitting: Family Medicine

## 2021-02-20 ENCOUNTER — Encounter: Payer: Self-pay | Admitting: Family Medicine

## 2021-02-20 VITALS — BP 114/86 | HR 70 | Ht 64.0 in | Wt 193.0 lb

## 2021-02-20 DIAGNOSIS — M25572 Pain in left ankle and joints of left foot: Secondary | ICD-10-CM | POA: Diagnosis not present

## 2021-02-20 NOTE — Patient Instructions (Addendum)
Cam walker  Atl Tylenol and Advil Ice Continue Vit D See me again in 4-6 weeks

## 2021-02-20 NOTE — Assessment & Plan Note (Signed)
Patient may have a stress fracture.  Unable to wear the Aircast previously so we will try a cam walker at this time.  Discussed icing regimen and home exercises.  Discussed which activities to do which wants to avoid.  Patient is able to do biking in a shoe if needed.  Follow-up with me again 4 weeks

## 2021-02-21 ENCOUNTER — Ambulatory Visit: Payer: 59

## 2021-02-21 ENCOUNTER — Ambulatory Visit
Admission: RE | Admit: 2021-02-21 | Discharge: 2021-02-21 | Disposition: A | Discharge status: Home / Self Care | Payer: 59 | Source: Ambulatory Visit | Attending: Obstetrics and Gynecology | Admitting: Obstetrics and Gynecology

## 2021-02-21 DIAGNOSIS — R928 Other abnormal and inconclusive findings on diagnostic imaging of breast: Secondary | ICD-10-CM

## 2021-02-21 DIAGNOSIS — N6001 Solitary cyst of right breast: Secondary | ICD-10-CM | POA: Diagnosis not present

## 2021-02-28 ENCOUNTER — Other Ambulatory Visit (HOSPITAL_BASED_OUTPATIENT_CLINIC_OR_DEPARTMENT_OTHER): Payer: Self-pay

## 2021-02-28 MED ORDER — HYDROCODONE-ACETAMINOPHEN 5-325 MG PO TABS
ORAL_TABLET | ORAL | 0 refills | Status: DC
  Filled 2021-02-28: qty 14, 3d supply, fill #0

## 2021-02-28 MED ORDER — CLINDAMYCIN HCL 150 MG PO CAPS
ORAL_CAPSULE | ORAL | 0 refills | Status: DC
  Filled 2021-02-28: qty 28, 7d supply, fill #0

## 2021-03-05 NOTE — Progress Notes (Signed)
61 y.o. G98P2002 Married Other or two or more races Not Hispanic or Latino female here for annual exam. No vaginal bleeding.   ?Sexually active without penetration secondary to ED. Husband has MS, doing okay.  ? ?Thinks she has undiagnosed ADHD.  ? ?   ?H/o IBS/constipation, she is being treated with Linzess. Doesn't seem to be helping as much as it used to.  ?  ? ?Patient's last menstrual period was 10/07/2014 (approximate).          ?Sexually active: Yes.    ?The current method of family planning is post menopausal status.    ?Exercising: Yes.     Walking 3.5 miles cycling  ?Smoker:  no ? ?Health Maintenance: ?Pap:  10/12/2017 WNL NEG HPV, 10/19/2014 normal ?History of abnormal Pap:  no ?MMG:  02/21/21 Density B Bi-rads 2 benign  ?BMD:   none  ?Colonoscopy: 07-15-12 polyps, f/u in 10 years ?TDaP:  01/15/11 ?Gardasil: n/a ? ? reports that she has quit smoking. Her smoking use included cigarettes. She has a 26.00 pack-year smoking history. She has never used smokeless tobacco. She reports that she does not drink alcohol and does not use drugs. 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 Plans to retire at 39.  ?Husband is a MD in [employer]. Son and daughter are grown, both with degree's in music.  ?Son has a PhD in music composition.  ? ?Past Medical History:  ?Diagnosis Date  ? Anxiety   ? Fibroid   ? Gastroesophageal reflux   ? GERD (gastroesophageal reflux disease)   ? Hyperlipidemia   ? Hypertension   ? IBS (irritable bowel syndrome)   ? Joint pain   ? Osteoarthritis   ? both knees  ? Prediabetes   ? Prediabetes   ? ? ?Past Surgical History:  ?Procedure Laterality Date  ? CESAREAN SECTION    ? x 2  ? EYE SURGERY Right   ? KNEE ARTHROSCOPY Right   ? KNEE SURGERY Right   ? removal of large cyst-non-cancerous  ? TONSILLECTOMY    ? UMBILICAL HERNIA REPAIR    ? WISDOM TOOTH EXTRACTION    ? ? ?Current Outpatient Medications  ?Medication Sig Dispense Refill  ? clindamycin (CLEOCIN) 150 MG capsule Take 1  capsule by mouth 4 times daily until complete 28 capsule 0  ? HYDROcodone-acetaminophen (NORCO/VICODIN) 5-325 MG tablet Take 1 tablet by mouth every 4 - 6 hours as needed pain 14 tablet 0  ? linaclotide (LINZESS) 290 MCG CAPS capsule Take 1 capsule (290 mcg total) by mouth daily before breakfast. 90 capsule 3  ? omeprazole (PRILOSEC) 20 MG capsule TAKE 1 CAPSULE (20 MG TOTAL) BY MOUTH 2 (TWO) TIMES DAILY BEFORE A MEAL. 180 capsule 3  ? Vitamin D, Ergocalciferol, (DRISDOL) 1.25 MG (50000 UNIT) CAPS capsule Take 1 capsule (50,000 Units total) by mouth every 7 (seven) days. 12 capsule 0  ? ?No current facility-administered medications for this visit.  ? ? ?Family History  ?Problem Relation Age of Onset  ? Hyperlipidemia Mother   ? Hypertension Mother   ? Diabetes Mother   ? Stroke Sister   ? Heart disease Maternal Grandmother   ? Colon cancer Neg Hx   ? ? ?Review of Systems ? ?Exam:   ?LMP 10/07/2014 (Approximate)   Weight change: @WEIGHTCHANGE @ Height:      ?Ht Readings from Last 3 Encounters:  ?02/20/21 5\' 4"  (1.626 m)  ?01/30/21 5\' 4"  (1.626 m)  ?01/23/21 5\' 4"  (  1.626 m)  ? ? ?General appearance: alert, cooperative and appears stated age ?Head: Normocephalic, without obvious abnormality, atraumatic ?Neck: no adenopathy, supple, symmetrical, trachea midline and thyroid normal to inspection and palpation ?Lungs: clear to auscultation bilaterally ?Cardiovascular: regular rate and rhythm ?Breasts: normal appearance, no masses or tenderness ?Abdomen: soft, non-tender; non distended,  no masses,  no organomegaly ?Extremities: extremities normal, atraumatic, no cyanosis or edema ?Skin: Skin color, texture, turgor normal. No rashes or lesions ?Lymph nodes: Cervical, supraclavicular, and axillary nodes normal. ?No abnormal inguinal nodes palpated ?Neurologic: Grossly normal ? ? ?Pelvic: External genitalia:  no lesions ?             Urethra:  normal appearing urethra with no masses, tenderness or lesions ?              Bartholins and Skenes: normal    ?             Vagina: normal appearing vagina with normal color and discharge, no lesions ?             Cervix: no lesions ?              ?Bimanual Exam:  Uterus:   no masses or tenderness ?             Adnexa: no mass, fullness, tenderness ?              Rectovaginal: Confirms ?              Anus:  normal sphincter tone, no lesions ? ?Lovena Le, CMA chaperoned for the exam. ? ?1. Well woman exam ?Mammogram and colonoscopy UTD ?No pap this year ?Discussed breast self exam ?Discussed calcium and vit D intake ?Labs and immunizations with her primary ? ? ?

## 2021-03-12 ENCOUNTER — Encounter: Payer: Self-pay | Admitting: Nurse Practitioner

## 2021-03-12 ENCOUNTER — Encounter: Payer: Self-pay | Admitting: Obstetrics and Gynecology

## 2021-03-12 ENCOUNTER — Other Ambulatory Visit: Payer: Self-pay

## 2021-03-12 ENCOUNTER — Ambulatory Visit (INDEPENDENT_AMBULATORY_CARE_PROVIDER_SITE_OTHER): Payer: 59 | Admitting: Obstetrics and Gynecology

## 2021-03-12 VITALS — BP 134/82 | HR 83 | Ht 64.0 in | Wt 186.0 lb

## 2021-03-12 DIAGNOSIS — Z01419 Encounter for gynecological examination (general) (routine) without abnormal findings: Secondary | ICD-10-CM

## 2021-03-12 NOTE — Patient Instructions (Signed)

## 2021-03-22 DIAGNOSIS — H524 Presbyopia: Secondary | ICD-10-CM | POA: Diagnosis not present

## 2021-03-26 NOTE — Progress Notes (Signed)
?Charlann Boxer D.O. ?[facility] ?[place] ?Phone: [phone] ?Subjective:   ?I, Jacqualin Combes, am serving as a scribe for Dr. Hulan Saas. ? ?This visit occurred during the SARS-CoV-2 public health emergency.  Safety protocols were in place, including screening questions prior to the visit, additional usage of staff PPE, and extensive cleaning of exam room while observing appropriate contact time as indicated for disinfecting solutions.  ? ? ? ?I'm seeing this patient by the request  of:  [name], [name], NP ? ?CC: Left ankle pain follow-up ? ?FBP:[id number]  ?02/20/2021 ?Patient may have a stress fracture.  Unable to wear the Aircast previously so we will try a cam walker at this time.  Discussed icing regimen and home exercises.  Discussed which activities to do which wants to avoid.  Patient is able to do biking in a shoe if needed.  Follow-up with me again 4 weeks ? ?Updated 03/27/2021 ?Elizabeth Brooks is a 61 y.o. female coming in with complaint of L ankle pain. Patient has been able to bike and use elliptical. Patient has been wearing cam walker. Started taking naproxen since last visit. Patient forgot to take naproxen and 2 extra strength Tyelnol 2 days ago and she had increase in pain. Patient's pain is over medial longitudinal arch.  ? ? ? ?  ? ?Past Medical History:  ?Diagnosis Date  ? Anxiety   ? Fibroid   ? Gastroesophageal reflux   ? GERD (gastroesophageal reflux disease)   ? Hyperlipidemia   ? Hypertension   ? IBS (irritable bowel syndrome)   ? Joint pain   ? Osteoarthritis   ? both knees  ? Prediabetes   ? Prediabetes   ? ?Past Surgical History:  ?Procedure Laterality Date  ? CESAREAN SECTION    ? x 2  ? EYE SURGERY Right   ? KNEE ARTHROSCOPY Right   ? KNEE SURGERY Right   ? removal of large cyst-non-cancerous  ? TONSILLECTOMY    ? UMBILICAL HERNIA REPAIR    ? WISDOM TOOTH EXTRACTION    ? ?Social History  ? ?Socioeconomic History  ? Marital status: Married   ?  Spouse name: Blondine Hottel  ? Number of children: 2  ? Years of education: Not on file  ? Highest education level: Not on file  ?Occupational History  ? 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 Smoking status: Former  ?  Packs/day: 1.00  ?  Years: 26.00  ?  Pack years: 26.00  ?  Types: Cigarettes  ? Smokeless tobacco: Never  ?Vaping Use  ? Vaping Use: Never used  ?Substance and Sexual Activity  ? Alcohol use: No  ? Drug use: No  ? Sexual activity: Yes  ?  Partners: Male  ?  Birth control/protection: Post-menopausal  ?Other Topics Concern  ? Not on file  ?Social History Narrative  ? Not on file  ? ?Social Determinants of Health  ? ?Financial Resource Strain: Not on file  ?Food Insecurity: Not on file  ?Transportation Needs: Not on file  ?Physical Activity: Not on file  ?Stress: Not on file  ?Social Connections: Not on file  ? ?Allergies  ?Allergen Reactions  ? Penicillins Anaphylaxis and Rash  ? ?Family History  ?Problem Relation Age of Onset  ? Hyperlipidemia Mother   ? Hypertension Mother   ? Diabetes Mother   ? Stroke Sister   ? Heart disease Maternal Grandmother   ? Colon cancer Neg  Hx   ? ? ? ? ? ? ? ?Current Outpatient Medications (Other):  ?  linaclotide (LINZESS) 290 MCG CAPS capsule, Take 1 capsule (290 mcg total) by mouth daily before breakfast. ?  omeprazole (PRILOSEC) 20 MG capsule, TAKE 1 CAPSULE (20 MG TOTAL) BY MOUTH 2 (TWO) TIMES DAILY BEFORE A MEAL. ?  Vitamin D, Ergocalciferol, (DRISDOL) 1.25 MG (50000 UNIT) CAPS capsule, Take 1 capsule (50,000 Units total) by mouth every 7 (seven) days. ? ? ?Reviewed prior external information including notes and imaging from  ?primary care provider ?As well as notes that were available from care everywhere and other healthcare systems. ? ?Past medical history, social, surgical and family history all reviewed in electronic medical record.  No pertanent information unless stated regarding to the chief complaint.  ? ?Review of Systems: ? No headache, visual  changes, nausea, vomiting, diarrhea, constipation, dizziness, abdominal pain, skin rash, fevers, chills, night sweats, weight loss, swollen lymph nodes, body aches, joint swelling, chest pain, shortness of breath, mood changes. POSITIVE muscle aches ? ?Objective  ?Blood pressure 124/86, pulse 88, height '5\' 4"'$  (1.626 m), weight 191 lb (86.6 kg), last menstrual period 10/07/2014, SpO2 95 %. ?  ?General: No apparent distress alert and oriented x3 mood and affect normal, dressed appropriately.  ?HEENT: Pupils equal, extraocular movements intact  ?Respiratory: Patient's speak in full sentences and does not appear short of breath  ?Cardiovascular: No lower extremity edema, non tender, no erythema  ?Gait still antalgic noted ?Left ankle exam has significant decrease in swelling from previous exam but still severely tender over the medial aspect just inferior to the medial malleolus.  Patient does have a possible movable artifact in the area.  Tender to palpation severely.  Some tenderness over the posterior tibialis tendon as well. ? ?Limited muscular skeletal ultrasound was performed and interpreted by Hulan Saas, M  ?Limited ultrasound does show the patient does have still some narrowing noted of the medial joint space.  Patient has significant less hypoechoic changes but there is more calcific changes.  With compression appears that there may be a loose body noted in the ankle mortise itself.  Patient does have abnormal posterior tibialis tendon changes with hypoechoic changes and increasing in neovascularization near the insertion on the navicular bone.  Navicular bone does still have some discomfort with compression of this area.  Cortical irregularity still noted as well. ?Impression: navicular bone abnormal, likely partial tear of the posterior tibialis tendon.  Also abnormality of the ankle joint in the tarsal tunnel that could be a possible loose body ?  ?Impression and Recommendations:  ?  ? ?The above  documentation has been reviewed and is accurate and complete Lyndal Pulley, DO ? ? ? ?

## 2021-03-27 ENCOUNTER — Encounter: Payer: Self-pay | Admitting: Family Medicine

## 2021-03-27 ENCOUNTER — Ambulatory Visit (INDEPENDENT_AMBULATORY_CARE_PROVIDER_SITE_OTHER): Payer: 59 | Admitting: Family Medicine

## 2021-03-27 ENCOUNTER — Ambulatory Visit: Payer: Self-pay

## 2021-03-27 ENCOUNTER — Other Ambulatory Visit: Payer: Self-pay

## 2021-03-27 VITALS — BP 124/86 | HR 88 | Ht 64.0 in | Wt 191.0 lb

## 2021-03-27 DIAGNOSIS — M25572 Pain in left ankle and joints of left foot: Secondary | ICD-10-CM | POA: Diagnosis not present

## 2021-03-27 NOTE — Patient Instructions (Addendum)
Keep wearing boot ?Okay to be active in the boot ?[place] [phone] ?Call Today ? ?When we receive your results we will contact you. ? ?

## 2021-03-27 NOTE — Assessment & Plan Note (Signed)
Patient is continuing to have left ankle pain.  Patient stated that she is feeling 70% better but on exam today I do not feel there is anything better except for the entire coordination.  Patient does still have what appears to be cortical irregularity noted of the medial talar dome.  Patient does have narrowing of the tarsal tunnel that is concerning as well with patient's symptoms.  Questionable intermittent spotting in the area but I do feel that further evaluation is needed with patient having instability of the ankle.  Patient will be ordered an MRI and depending on findings we will discuss different treatment at that time. ?

## 2021-04-05 ENCOUNTER — Ambulatory Visit
Admission: RE | Admit: 2021-04-05 | Discharge: 2021-04-05 | Disposition: A | Discharge status: Home / Self Care | Payer: 59 | Source: Ambulatory Visit | Attending: Family Medicine | Admitting: Family Medicine

## 2021-04-05 DIAGNOSIS — R600 Localized edema: Secondary | ICD-10-CM | POA: Diagnosis not present

## 2021-04-05 DIAGNOSIS — M25572 Pain in left ankle and joints of left foot: Secondary | ICD-10-CM

## 2021-04-09 ENCOUNTER — Encounter: Payer: Self-pay | Admitting: Family Medicine

## 2021-05-30 ENCOUNTER — Other Ambulatory Visit: Payer: Self-pay

## 2021-05-30 ENCOUNTER — Other Ambulatory Visit (HOSPITAL_BASED_OUTPATIENT_CLINIC_OR_DEPARTMENT_OTHER): Payer: Self-pay

## 2021-05-30 DIAGNOSIS — T7840XA Allergy, unspecified, initial encounter: Secondary | ICD-10-CM | POA: Diagnosis not present

## 2021-05-30 DIAGNOSIS — Z9109 Other allergy status, other than to drugs and biological substances: Secondary | ICD-10-CM

## 2021-05-30 MED ORDER — PREDNISONE 10 MG PO TABS
ORAL_TABLET | ORAL | 0 refills | Status: DC
  Filled 2021-05-30: qty 21, 6d supply, fill #0

## 2021-05-30 NOTE — Patient Outreach (Signed)
Received a Nurse Call Center notification for Elizabeth Brooks . The Primary Care Physician has a [facility] Embedded Nurse.   I have sent a referral to the Embedded Team to follow up.   [facility], [place], [place] Management [phone]

## 2021-06-04 ENCOUNTER — Other Ambulatory Visit (HOSPITAL_COMMUNITY): Payer: Self-pay

## 2021-07-11 ENCOUNTER — Other Ambulatory Visit (HOSPITAL_COMMUNITY): Payer: Self-pay

## 2021-07-29 ENCOUNTER — Ambulatory Visit: Payer: 59 | Admitting: Nurse Practitioner

## 2021-07-31 ENCOUNTER — Telehealth: Payer: Self-pay | Admitting: *Deleted

## 2021-07-31 NOTE — Chronic Care Management (AMB) (Signed)
  Care Coordination  Note  07/31/2021 Name: Elizabeth Brooks MRN: [id number] DOB: [birth year]  Elizabeth Brooks is a 61 y.o. year old female who is a primary care patient of [name], [name], NP. I reached out to [employer] by phone today to offer care coordination services.       Follow up plan: Unsuccessful telephone outreach attempt made. A HIPAA compliant phone message was left for the patient providing contact information and requesting a return call.   Julian Hy, [place] Direct Dial: [phone]

## 2021-08-14 ENCOUNTER — Encounter (INDEPENDENT_AMBULATORY_CARE_PROVIDER_SITE_OTHER): Payer: Self-pay

## 2021-08-15 NOTE — Chronic Care Management (AMB) (Signed)
  Care Coordination   Note   08/15/2021  Discussed care coordination services with pt who declined at this time has an upcoming appt with allergist (referral was placed for environmental allergies) and will call back if wants to participate in services later.

## 2021-08-15 NOTE — Chronic Care Management (AMB) (Signed)
  Care Coordination  Outreach Note  08/15/2021 Name: Elizabeth Brooks MRN: [id number] DOB: 13-Dec-1960   Care Coordination Outreach Attempts  A second unsuccessful outreach was attempted today to offer the patient with information about available care coordination services as a benefit of their health plan.     Referral received   Follow Up Plan:  Additional outreach attempts will be made to offer the patient care coordination information and services.   Encounter Outcome:  No Answer  Julian Hy, [place] Direct Dial: [phone]

## 2021-08-20 ENCOUNTER — Encounter: Payer: Self-pay | Admitting: Allergy and Immunology

## 2021-08-20 ENCOUNTER — Other Ambulatory Visit (HOSPITAL_BASED_OUTPATIENT_CLINIC_OR_DEPARTMENT_OTHER): Payer: Self-pay

## 2021-08-20 ENCOUNTER — Ambulatory Visit (INDEPENDENT_AMBULATORY_CARE_PROVIDER_SITE_OTHER): Payer: 59 | Admitting: Allergy and Immunology

## 2021-08-20 VITALS — BP 120/76 | HR 60 | Temp 98.4°F | Resp 16 | Ht 64.0 in | Wt 187.6 lb

## 2021-08-20 DIAGNOSIS — L299 Pruritus, unspecified: Secondary | ICD-10-CM | POA: Diagnosis not present

## 2021-08-20 DIAGNOSIS — J301 Allergic rhinitis due to pollen: Secondary | ICD-10-CM | POA: Diagnosis not present

## 2021-08-20 DIAGNOSIS — T783XXA Angioneurotic edema, initial encounter: Secondary | ICD-10-CM | POA: Diagnosis not present

## 2021-08-20 DIAGNOSIS — T7840XA Allergy, unspecified, initial encounter: Secondary | ICD-10-CM

## 2021-08-20 MED ORDER — EPINEPHRINE 0.3 MG/0.3ML IJ SOAJ
0.3000 mg | Freq: Once | INTRAMUSCULAR | 1 refills | Status: AC
  Filled 2021-08-20: qty 2, 2d supply, fill #0

## 2021-08-20 NOTE — Patient Instructions (Addendum)
  1.  Allergen avoidance measures ???  2.  Further evaluation ???  Yes, if with recurrent reactions  3.  EpiPen, Benadryl, MD/ER evaluation for immune reaction  4.  Plan for fall flu vaccine  5.  Can use antihistamine on a daily basis with use of Zyrtec or Claritin 10 mg 1 time per day

## 2021-08-20 NOTE — Progress Notes (Unsigned)
[facility] [place] [facility] [place] [facility] [place] [facility] [place]   Dear Lorayne Marek,  Thank you for referring Elizabeth Brooks to the [facility] on 08/20/2021.   Below is a summation of this patient's evaluation and recommendations.  Thank you for your referral. I will keep you informed about this patient's response to treatment.   If you have any questions please do not hesitate to contact me.   Sincerely,  Jiles Prows, MD Allergy / Immunology [facility]   ______________________________________________________________________    NEW PATIENT NOTE  Referring Provider: Flossie Buffy, NP Primary Provider: Flossie Buffy, NP Date of office visit: 08/20/2021    Subjective:   Chief Complaint:  Elizabeth Brooks (DOB: 1960-06-10) is a 61 y.o. female who presents to the clinic on 08/20/2021 with a chief complaint of Allergy Testing (Environmental:/Food: [employer] - hands itch) .     HPI: Elizabeth Brooks presents to this clinic in evaluation of a allergic reaction.  In April 2023 she developed very severe acute nasal congestion, palate swelling, inspiratory wheezing, difficult to breathe, throat closing, all manifested within 1 hour of onset for which she took a Benadryl.  She went to bed that night she woke up the next morning with very swollen eyelids.  There was not an obvious provoking factor giving rise to this issue.  She did not start any new prescription medications, over-the-counter medications, herbs, change in diet, significant change in her environment.  However, she did have nasal congestion and runny nose and sneezing starting 4 days prior to this reaction for which she took a Claritin and then subsequently as Zyrtec-D starting about 2 days prior to this reaction.  Fortunately, she has not had any recurrent reactions since that event in April.  She does get hand itching and feet itching  that appears to occur spontaneously and intermittently a few times per week that lasts for few minutes she has been stuck in this pattern for years without any obvious trigger.  She has a history of childhood allergic rhinoconjunctivitis for which she underwent a course of immunotherapy and this is much better at this point in time.  Occasionally she will get some allergies during spring season for which she may use a antihistamine.  Past Medical History:  Diagnosis Date   Anxiety    Fibroid    Gastroesophageal reflux    GERD (gastroesophageal reflux disease)    Hyperlipidemia    Hypertension    IBS (irritable bowel syndrome)    Joint pain    Osteoarthritis    both knees   Prediabetes    Prediabetes     Past Surgical History:  Procedure Laterality Date   CESAREAN SECTION     x 2   EYE SURGERY Right    KNEE ARTHROSCOPY Right    KNEE SURGERY Right    removal of large cyst-non-cancerous   TONSILLECTOMY     UMBILICAL HERNIA REPAIR     WISDOM TOOTH EXTRACTION      Allergies as of 08/20/2021       Reactions   Penicillins Anaphylaxis, Rash        Medication List    cetirizine 10 MG tablet Commonly known as: ZYRTEC Take 1 tablet by mouth as needed.   diphenhydrAMINE 25 mg capsule Commonly known as: BENADRYL Take 1 capsule by mouth as needed.   Linzess 290 MCG Caps capsule Generic drug: linaclotide Take 1 capsule (  290 mcg total) by mouth daily before breakfast.   omeprazole 20 MG capsule Commonly known as: PRILOSEC TAKE 1 CAPSULE (20 MG TOTAL) BY MOUTH 2 (TWO) TIMES DAILY BEFORE A MEAL.    Review of systems negative except as noted in HPI / PMHx or noted below:  Review of Systems  Constitutional: Negative.   HENT: Negative.    Eyes: Negative.   Respiratory: Negative.    Cardiovascular: Negative.   Gastrointestinal: Negative.   Genitourinary: Negative.   Musculoskeletal: Negative.   Skin: Negative.   Neurological: Negative.   Endo/Heme/Allergies:  Negative.   Psychiatric/Behavioral: Negative.      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   Tobacco Use   Smoking status: Former    Packs/day: 1.00    Years: 26.00    Total pack years: 26.00    Types: Cigarettes   Smokeless tobacco: Never  Vaping Use   Vaping Use: Never used  Substance and Sexual Activity   Alcohol use: No   Drug use: No   Sexual activity: Yes    Partners: Male    Birth control/protection: Post-menopausal  Other Topics Concern   Not on file  Social History Narrative   Not on file   Environmental and Social history  Lives in a apartment with a dry environment, a dog located inside the household, carpet in the bedroom, no plastic on the bed, no plastic on the pillow, and no smoking ongoing with inside the household.  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  Objective:   Vitals:   08/20/21 1421  BP: 120/76  Pulse: 60  Resp: 16  Temp: 98.4 F (36.9 C)  SpO2: 99%   Height: '5\' 4"'$  (162.6 cm) Weight: 187 lb 9.6 oz (85.1 kg)  Physical Exam Constitutional:      Appearance: She is not diaphoretic.  HENT:     Head: Normocephalic.     Right Ear: Tympanic membrane, ear canal and external ear normal.     Left Ear: Tympanic membrane, ear canal and external ear normal.     Nose: Nose normal. No mucosal edema or rhinorrhea.     Mouth/Throat:     Pharynx: Uvula midline. No oropharyngeal exudate.  Eyes:     Conjunctiva/sclera: Conjunctivae normal.  Neck:     Thyroid: No thyromegaly.     Trachea: Trachea normal. No tracheal  tenderness or tracheal deviation.  Cardiovascular:     Rate and Rhythm: Normal rate and regular rhythm.     Heart sounds: Normal heart sounds, S1 normal and S2 normal. No murmur heard. Pulmonary:     Effort: No respiratory distress.     Breath sounds: Normal breath sounds. No stridor. No wheezing or rales.  Lymphadenopathy:     Head:     Right side of head: No tonsillar adenopathy.     Left side of head: No tonsillar adenopathy.     Cervical: No cervical adenopathy.  Skin:  Findings: No erythema or rash.     Nails: There is no clubbing.  Neurological:     Mental Status: She is alert.     Diagnostics: Allergy skin tests were performed.   Assessment and Plan:    1. Allergic reaction, initial encounter   2. Angioedema, initial encounter   3. Pruritic disorder   4. Seasonal allergic rhinitis due to pollen     Patient Instructions   1.  Allergen avoidance measures ???  2.  Further evaluation ???  Yes, if with recurrent reactions  3.  EpiPen, Benadryl, MD/ER evaluation for immune reaction  4.  Plan for fall flu vaccine  5.  Can use antihistamine on a daily basis with use of Zyrtec or Claritin 10 mg 1 time per day    Jiles Prows, MD Allergy / Immunology [facility]

## 2021-08-21 ENCOUNTER — Encounter: Payer: Self-pay | Admitting: Allergy and Immunology

## 2021-08-21 ENCOUNTER — Other Ambulatory Visit (HOSPITAL_BASED_OUTPATIENT_CLINIC_OR_DEPARTMENT_OTHER): Payer: Self-pay

## 2021-08-27 ENCOUNTER — Institutional Professional Consult (permissible substitution): Payer: 59 | Admitting: Plastic Surgery

## 2021-08-27 ENCOUNTER — Encounter: Payer: Self-pay | Admitting: Family Medicine

## 2021-08-28 ENCOUNTER — Other Ambulatory Visit (HOSPITAL_COMMUNITY): Payer: Self-pay

## 2021-10-03 ENCOUNTER — Ambulatory Visit: Payer: 59 | Admitting: Nurse Practitioner

## 2021-10-09 ENCOUNTER — Encounter: Payer: Self-pay | Admitting: Nurse Practitioner

## 2021-10-09 ENCOUNTER — Ambulatory Visit (INDEPENDENT_AMBULATORY_CARE_PROVIDER_SITE_OTHER): Payer: 59 | Admitting: Nurse Practitioner

## 2021-10-09 VITALS — BP 130/90 | HR 60 | Temp 97.2°F | Ht 64.0 in | Wt 174.8 lb

## 2021-10-09 DIAGNOSIS — E785 Hyperlipidemia, unspecified: Secondary | ICD-10-CM | POA: Diagnosis not present

## 2021-10-09 DIAGNOSIS — Z0001 Encounter for general adult medical examination with abnormal findings: Secondary | ICD-10-CM

## 2021-10-09 DIAGNOSIS — R739 Hyperglycemia, unspecified: Secondary | ICD-10-CM | POA: Diagnosis not present

## 2021-10-09 DIAGNOSIS — F4322 Adjustment disorder with anxiety: Secondary | ICD-10-CM

## 2021-10-09 DIAGNOSIS — Z23 Encounter for immunization: Secondary | ICD-10-CM

## 2021-10-09 DIAGNOSIS — H6123 Impacted cerumen, bilateral: Secondary | ICD-10-CM | POA: Diagnosis not present

## 2021-10-09 LAB — LIPID PANEL
Cholesterol: 237 mg/dL — ABNORMAL HIGH (ref 0–200)
HDL: 54.2 mg/dL (ref >39.00)
LDL Cholesterol: 169 mg/dL — ABNORMAL HIGH (ref 0–99)
NonHDL: 183.05
Total CHOL/HDL Ratio: 4
Triglycerides: 68 mg/dL (ref 0.0–149.0)
VLDL: 13.6 mg/dL (ref 0.0–40.0)

## 2021-10-09 LAB — COMPREHENSIVE METABOLIC PANEL
ALT: 13 U/L (ref 0–35)
AST: 18 U/L (ref 0–37)
Albumin: 4.4 g/dL (ref 3.5–5.2)
Alkaline Phosphatase: 45 U/L (ref 39–117)
BUN: 12 mg/dL (ref 6–23)
CO2: 29 mEq/L (ref 19–32)
Calcium: 9.3 mg/dL (ref 8.4–10.5)
Chloride: 104 mEq/L (ref 96–112)
Creatinine, Ser: 0.86 mg/dL (ref 0.40–1.20)
GFR: 73.83 mL/min (ref >60.00)
Glucose, Bld: 87 mg/dL (ref 70–99)
Potassium: 3.7 mEq/L (ref 3.5–5.1)
Sodium: 140 mEq/L (ref 135–145)
Total Bilirubin: 0.9 mg/dL (ref 0.2–1.2)
Total Protein: 6.8 g/dL (ref 6.0–8.3)

## 2021-10-09 LAB — CBC
HCT: 39 % (ref 36.0–46.0)
Hemoglobin: 13 g/dL (ref 12.0–15.0)
MCHC: 33.4 g/dL (ref 30.0–36.0)
MCV: 90.1 fl (ref 78.0–100.0)
Platelets: 278 10*3/uL (ref 150.0–400.0)
RBC: 4.33 Mil/uL (ref 3.87–5.11)
RDW: 13.8 % (ref 11.5–15.5)
WBC: 5.4 10*3/uL (ref 4.0–10.5)

## 2021-10-09 LAB — TSH: TSH: 1.25 u[IU]/mL (ref 0.35–5.50)

## 2021-10-09 LAB — HEMOGLOBIN A1C: Hgb A1c MFr Bld: 5.8 % (ref 4.6–6.5)

## 2021-10-09 NOTE — Progress Notes (Signed)
Complete physical exam  Patient: Elizabeth Brooks   DOB: 02-23-1960   61 y.o. Female  MRN: [id number] Visit Date: 10/09/2021  Subjective:    Chief Complaint  Patient presents with   Annual Exam    CPE Pt fasting  No concerns  Shingles & flu given today  Requesting records for PAP   Elizabeth Brooks is a 61 y.o. female who presents today for a complete physical exam. She reports consuming a low fat and low sodium diet. Home exercise routine includes calisthenics and weight training daily. She generally feels well. She reports sleeping poorly. She does have additional problems to discuss today.  Vision:yes Dental:Yes STD Screen:No  Wt Readings from Last 3 Encounters:  10/09/21 174 lb 12.8 oz (79.3 kg)  08/20/21 187 lb 9.6 oz (85.1 kg)  03/27/21 191 lb (86.6 kg)    BP Readings from Last 3 Encounters:  10/09/21 (!) 130/90  08/20/21 120/76  03/27/21 124/86  . Most recent fall risk assessment:    10/09/2021    9:18 AM  Fall Risk   Falls in the past year? 0  Number falls in past yr: 0  Injury with Fall? 0   Most recent depression screenings:    10/09/2021   10:17 AM 02/16/2018    8:58 AM  PHQ 2/9 Scores  PHQ - 2 Score 0 4  PHQ- 9 Score 0 14  Exception Documentation  Medical reason   HPI  Adjustment disorder with anxious mood Increased anxiety due to Difficulty in finding words during conversations, easily distracted when doing task at home and work,  Able to to complete tasks but it takes her longer and has to work out of normal working hours, increased anxiety when interacting with people because she is concern she might not be able to find her words, occasionally forgets names. Bites her nails or picks on her curticles. Difficulty falling and staying asleep (bedtime 79mndnight, watches TV while in bed) She is concerned about possible early onset dementia vs ADD vs anxiety. No issues with completing home tasks, No restlessness. Son with ADD No caffeine or ETOH or illicit  use.  Entered referral to psychiatry  Past Medical History:  Diagnosis Date   Anxiety    Fibroid    Gastroesophageal reflux    GERD (gastroesophageal reflux disease)    Hyperlipidemia    Hypertension    IBS (irritable bowel syndrome)    Joint pain    Osteoarthritis    both knees   Prediabetes    Prediabetes    Past Surgical History:  Procedure Laterality Date   CESAREAN SECTION     x 2   EYE SURGERY Right    KNEE ARTHROSCOPY Right    KNEE SURGERY Right    removal of large cyst-non-cancerous   TONSILLECTOMY     UMBILICAL HERNIA REPAIR     WISDOM TOOTH EXTRACTION     Social History   Socioeconomic History   Marital status: Married    Spouse name: Elizabeth Brooks  Number of children: 2   Years of education: Not on file   Highest education level: Not on file  Occupational History   Occupation: RTherapist, [employer]- IT dept   Tobacco Use   Smoking status: Former    Packs/day: 1.00    Years: 26.00    Total pack years: 26.00    Types: Cigarettes   Smokeless tobacco: Never  Vaping Use   Vaping Use: Never used  Substance and Sexual Activity  Alcohol use: No   Drug use: No   Sexual activity: Yes    Partners: Male    Birth control/protection: Post-menopausal  Other Topics Concern   Not on file  Social History Narrative   Not on file   Social Determinants of Health   Financial Resource Strain: Not on file  Food Insecurity: Not on file  Transportation Needs: Not on file  Physical Activity: Not on file  Stress: Not on file  Social Connections: Not on file  Intimate Partner Violence: Not on file   Family Status  Relation Name Status   Mother  Alive   Sister  Alive   Brother  Alive   MGM  Deceased   Neg Hx  (Not Specified)   Family History  Problem Relation Age of Onset   Hyperlipidemia Mother    Hypertension Mother    Diabetes Mother    Dementia Mother 17   Stroke Sister    Heart disease Maternal Grandmother    Colon cancer Neg Hx    Allergies  Allergen  Reactions   Penicillins Anaphylaxis and Rash    Patient Care Team: Shaylie Eklund, [name], NP as PCP - General (Internal Medicine)   Medications: Outpatient Medications Prior to Visit  Medication Sig   cetirizine (ZYRTEC) 10 MG tablet Take 1 tablet by mouth as needed.   diphenhydrAMINE (BENADRYL) 25 mg capsule Take 1 capsule by mouth as needed.   linaclotide (LINZESS) 290 MCG CAPS capsule Take 1 capsule (290 mcg total) by mouth daily before breakfast.   omeprazole (PRILOSEC) 20 MG capsule TAKE 1 CAPSULE (20 MG TOTAL) BY MOUTH 2 (TWO) TIMES DAILY BEFORE A MEAL.   No facility-administered medications prior to visit.    Review of Systems  Constitutional:  Negative for fever.  HENT:  Positive for hearing loss. Negative for congestion and sore throat.   Eyes:        Negative for visual changes  Respiratory:  Negative for cough and shortness of breath.   Cardiovascular:  Negative for chest pain, palpitations and leg swelling.  Gastrointestinal:  Negative for blood in stool, constipation and diarrhea.  Genitourinary:  Negative for dysuria, frequency and urgency.  Musculoskeletal:  Negative for myalgias.  Skin:  Negative for rash.  Neurological:  Negative for dizziness and headaches.  Hematological:  Does not bruise/bleed easily.  Psychiatric/Behavioral:  Positive for decreased concentration and sleep disturbance. Negative for suicidal ideas. The patient is nervous/anxious.         Objective:  BP (!) 130/90 (BP Location: Right Arm, Cuff Size: Large)   Pulse 60   Temp (!) 97.2 F (36.2 C) (Temporal)   Ht '5\' 4"'$  (1.626 m)   Wt 174 lb 12.8 oz (79.3 kg)   LMP 10/07/2014 (Approximate)   SpO2 91%   BMI 30.00 kg/m     BP Readings from Last 3 Encounters:  10/09/21 (!) 130/90  08/20/21 120/76  03/27/21 124/86   Wt Readings from Last 3 Encounters:  10/09/21 174 lb 12.8 oz (79.3 kg)  08/20/21 187 lb 9.6 oz (85.1 kg)  03/27/21 191 lb (86.6 kg)   Physical Exam Constitutional:       General: She is not in acute distress. HENT:     Right Ear: External ear normal. There is impacted cerumen.     Left Ear: External ear normal. There is impacted cerumen.     Ears:     Comments: Normal ear canal and TM after removal of cerumen    Nose:  Nose normal.     Mouth/Throat:     Pharynx: No oropharyngeal exudate.  Eyes:     General: No scleral icterus. Cardiovascular:     Rate and Rhythm: Normal rate and regular rhythm.     Heart sounds: Normal heart sounds.  Pulmonary:     Effort: Pulmonary effort is normal. No respiratory distress.     Breath sounds: Normal breath sounds.  Abdominal:     General: There is no distension.     Palpations: Abdomen is soft.  Genitourinary:    Comments: Deferred breast and pelvic exam to GYN Musculoskeletal:        General: Normal range of motion.     Cervical back: Normal range of motion and neck supple.  Lymphadenopathy:     Cervical: No cervical adenopathy.  Skin:    General: Skin is warm and dry.  Neurological:     Mental Status: She is alert and oriented to person, place, and time.  Psychiatric:        Attention and Perception: Attention and perception normal.        Mood and Affect: Mood is anxious.        Speech: Speech normal.        Behavior: Behavior normal.        Thought Content: Thought content normal.        Cognition and Memory: Cognition and memory normal.        Judgment: Judgment normal.     Procedure Note :    Procedure :  Ear irrigation  Indication:  Cerumen impaction, bilateral  Risks, including pain, dizziness, eardrum perforation, bleeding, infection and others as well as benefits were explained to the patient in detail. Verbal consent was obtained and the patient agreed to proceed.   We used "The Elephant Ear Irrigation Device" filled with lukewarm water for irrigation. A large amount wax was recovered. Procedure has also required manual wax removal with an ear loop.  Tolerated well. Complications: None.  No  results found for any visits on 10/09/21.    Assessment & Plan:    Routine Health Maintenance and Physical Exam  Immunization History  Administered Date(s) Administered   Influenza Inj Mdck Quad Pf 10/27/2014   Influenza Inj Mdck Quad With Preservative 10/27/2014   Influenza, Seasonal, Injecte, Preservative Fre 10/19/2015   Influenza,inj,Quad PF,6+ Mos 10/27/2014, 10/09/2021   Influenza-Unspecified 09/24/2017, 10/20/2018   PFIZER Comirnaty(Gray Top)Covid-19 Tri-Sucrose Vaccine 04/11/2020   PFIZER(Purple Top)SARS-COV-2 Vaccination 01/14/2019, 02/02/2019, 09/14/2019   PPD Test 01/13/2011   Pfizer Covid-19 Vaccine Bivalent Booster 75yr & up 10/30/2020   Tdap 01/15/2011    Health Maintenance  Topic Date Due   Zoster Vaccines- Shingrix (1 of 2) Never done   PAP SMEAR-Modifier  10/12/2020   TETANUS/TDAP  10/10/2022 (Originally 01/14/2021)   Hepatitis C Screening  10/10/2022 (Originally 02/16/1980)   HIV Screening  10/10/2022 (Originally 02/15/1977)   COLONOSCOPY (Pts 45-42yrInsurance coverage will need to be confirmed)  07/16/2022   MAMMOGRAM  02/22/2023   INFLUENZA VACCINE  Completed   COVID-19 Vaccine  Completed   HPV VACCINES  Aged Out   Discussed health benefits of physical activity, and encouraged her to engage in regular exercise appropriate for her age and condition.  Problem List Items Addressed This Visit       Other   Adjustment disorder with anxious mood    Increased anxiety due to Difficulty in finding words during conversations, easily distracted when doing task at home and work,  Able to to complete tasks but it takes her longer and has to work out of normal working hours, increased anxiety when interacting with people because she is concern she might not be able to find her words, occasionally forgets names. Bites her nails or picks on her curticles. Difficulty falling and staying asleep (bedtime 22mndnight, watches TV while in bed) She is concerned about possible  early onset dementia vs ADD vs anxiety. No issues with completing home tasks, No restlessness. Son with ADD No caffeine or ETOH or illicit use.  Entered referral to psychiatry      Relevant Orders   Ambulatory referral to Psychiatry   Dyslipidemia   Relevant Orders   Lipid panel   Other Visit Diagnoses     Encounter for preventative adult health care exam with abnormal findings    -  Primary   Relevant Orders   CBC   Comprehensive metabolic panel   TSH   Hyperglycemia       Relevant Orders   Hemoglobin A1c   Bilateral hearing loss due to cerumen impaction       Need for immunization against influenza       Relevant Orders   Flu Vaccine QUAD 6+ mos PF IM (Fluarix Quad PF) (Completed)      Return in about 1 year (around 10/10/2022) for CPE (fasting).     CWilfred Lacy NP

## 2021-10-09 NOTE — Patient Instructions (Addendum)
Go to lab Schedule appt for mammogram 02/2022  Preventive Care 39-59 Years Old, Female Preventive care refers to lifestyle choices and visits with your health care provider that can promote health and wellness. Preventive care visits are also called wellness exams. What can I expect for my preventive care visit? Counseling Your health care provider may ask you questions about your: Medical history, including: Past medical problems. Family medical history. Pregnancy history. Current health, including: Menstrual cycle. Method of birth control. Emotional well-being. Home life and relationship well-being. Sexual activity and sexual health. Lifestyle, including: Alcohol, nicotine or tobacco, and drug use. Access to firearms. Diet, exercise, and sleep habits. Work and work Statistician. Sunscreen use. Safety issues such as seatbelt and bike helmet use. Physical exam Your health care provider will check your: Height and weight. These may be used to calculate your BMI (body mass index). BMI is a measurement that tells if you are at a healthy weight. Waist circumference. This measures the distance around your waistline. This measurement also tells if you are at a healthy weight and may help predict your risk of certain diseases, such as type 2 diabetes and high blood pressure. Heart rate and blood pressure. Body temperature. Skin for abnormal spots. What immunizations do I need?  Vaccines are usually given at various ages, according to a schedule. Your health care provider will recommend vaccines for you based on your age, medical history, and lifestyle or other factors, such as travel or where you work. What tests do I need? Screening Your health care provider may recommend screening tests for certain conditions. This may include: Lipid and cholesterol levels. Diabetes screening. This is done by checking your blood sugar (glucose) after you have not eaten for a while (fasting). Pelvic  exam and Pap test. Hepatitis B test. Hepatitis C test. HIV (human immunodeficiency virus) test. STI (sexually transmitted infection) testing, if you are at risk. Lung cancer screening. Colorectal cancer screening. Mammogram. Talk with your health care provider about when you should start having regular mammograms. This may depend on whether you have a family history of breast cancer. BRCA-related cancer screening. This may be done if you have a family history of breast, ovarian, tubal, or peritoneal cancers. Bone density scan. This is done to screen for osteoporosis. Talk with your health care provider about your test results, treatment options, and if necessary, the need for more tests. Follow these instructions at home: Eating and drinking  Eat a diet that includes fresh fruits and vegetables, whole grains, lean protein, and low-fat dairy products. Take vitamin and mineral supplements as recommended by your health care provider. Do not drink alcohol if: Your health care provider tells you not to drink. You are pregnant, may be pregnant, or are planning to become pregnant. If you drink alcohol: Limit how much you have to 0-1 drink a day. Know how much alcohol is in your drink. In the [place] one drink equals one 12 oz bottle of beer (355 mL), one 5 oz glass of wine (148 mL), or one 1 oz glass of hard liquor (44 mL). Lifestyle Brush your teeth every morning and night with fluoride toothpaste. Floss one time each day. Exercise for at least 30 minutes 5 or more days each week. Do not use any products that contain nicotine or tobacco. These products include cigarettes, chewing tobacco, and vaping devices, such as e-cigarettes. If you need help quitting, ask your health care provider. Do not use drugs. If you are sexually active, practice safe sex.  Use a condom or other form of protection to prevent STIs. If you do not wish to become pregnant, use a form of birth control. If you plan to become  pregnant, see your health care provider for a prepregnancy visit. Take aspirin only as told by your health care provider. Make sure that you understand how much to take and what form to take. Work with your health care provider to find out whether it is safe and beneficial for you to take aspirin daily. Find healthy ways to manage stress, such as: Meditation, yoga, or listening to music. Journaling. Talking to a trusted person. Spending time with friends and family. Minimize exposure to UV radiation to reduce your risk of skin cancer. Safety Always wear your seat belt while driving or riding in a vehicle. Do not drive: If you have been drinking alcohol. Do not ride with someone who has been drinking. When you are tired or distracted. While texting. If you have been using any mind-altering substances or drugs. Wear a helmet and other protective equipment during sports activities. If you have firearms in your house, make sure you follow all gun safety procedures. Seek help if you have been physically or sexually abused. What's next? Visit your health care provider once a year for an annual wellness visit. Ask your health care provider how often you should have your eyes and teeth checked. Stay up to date on all vaccines. This information is not intended to replace advice given to you by your health care provider. Make sure you discuss any questions you have with your health care provider. Document Revised: 06/20/2020 Document Reviewed: 06/20/2020 Elsevier Patient Education  [place].

## 2021-10-09 NOTE — Assessment & Plan Note (Signed)
Increased anxiety due to Difficulty in finding words during conversations, easily distracted when doing task at home and work,  Able to to complete tasks but it takes her longer and has to work out of normal working hours, increased anxiety when interacting with people because she is concern she might not be able to find her words, occasionally forgets names. Bites her nails or picks on her curticles. Difficulty falling and staying asleep (bedtime 49mndnight, watches TV while in bed) She is concerned about possible early onset dementia vs ADD vs anxiety. No issues with completing home tasks, No restlessness. Son with ADD No caffeine or ETOH or illicit use.  Entered referral to psychiatry

## 2021-10-24 ENCOUNTER — Telehealth: Payer: Self-pay

## 2021-10-24 NOTE — Telephone Encounter (Signed)
Pt called & requested to have her immunizations sent to her. Records sent via MyChart

## 2021-10-28 ENCOUNTER — Other Ambulatory Visit (HOSPITAL_COMMUNITY): Payer: Self-pay

## 2021-10-29 ENCOUNTER — Other Ambulatory Visit (HOSPITAL_BASED_OUTPATIENT_CLINIC_OR_DEPARTMENT_OTHER): Payer: Self-pay

## 2021-10-29 MED ORDER — COMIRNATY 30 MCG/0.3ML IM SUSY
PREFILLED_SYRINGE | INTRAMUSCULAR | 0 refills | Status: AC
  Filled 2021-10-29: qty 0.3, 1d supply, fill #0

## 2021-11-19 ENCOUNTER — Telehealth: Payer: Self-pay | Admitting: Plastic Surgery

## 2021-11-19 NOTE — Telephone Encounter (Signed)
LVM and My Chart message to please contact our office asap to r/s her 11/17 appt to Thursday 11/16.

## 2021-11-22 ENCOUNTER — Ambulatory Visit: Payer: 59 | Admitting: Plastic Surgery

## 2021-11-22 ENCOUNTER — Encounter: Payer: Self-pay | Admitting: Plastic Surgery

## 2021-11-22 VITALS — BP 130/80 | HR 73 | Ht 64.0 in | Wt 178.6 lb

## 2021-11-22 DIAGNOSIS — R21 Rash and other nonspecific skin eruption: Secondary | ICD-10-CM

## 2021-11-22 DIAGNOSIS — M549 Dorsalgia, unspecified: Secondary | ICD-10-CM

## 2021-11-22 DIAGNOSIS — M793 Panniculitis, unspecified: Secondary | ICD-10-CM

## 2021-11-22 DIAGNOSIS — M542 Cervicalgia: Secondary | ICD-10-CM | POA: Diagnosis not present

## 2021-11-26 ENCOUNTER — Encounter: Payer: Self-pay | Admitting: *Deleted

## 2021-11-26 ENCOUNTER — Encounter: Payer: Self-pay | Admitting: Plastic Surgery

## 2021-11-26 DIAGNOSIS — M793 Panniculitis, unspecified: Secondary | ICD-10-CM | POA: Insufficient documentation

## 2021-11-26 NOTE — Progress Notes (Signed)
Patient ID: Elizabeth Brooks, female    DOB: September 30, 1960, 61 y.o.   MRN: [id number]   Chief Complaint  Patient presents with   Advice Only   Skin Problem    The patient is a 61 year old female here for evaluation of her abdomen.  She is 5 feet 4 inches tall and currently weighs 178 pounds.  She has been able to decrease her weight from 240 pounds.  Complains of itching and irritation between her skin folds.  She did have a hemoglobin A1c checked and it was 5.8 in October.  She also had a hernia repaired around her umbilicus and was told that if she ever had an abdominoplasty she could possibly lose her bellybutton.  She quit smoking a number of years ago.  I do not feel a hernia or rectus diastases on her exam.    Review of Systems  Constitutional: Negative.   HENT: Negative.    Eyes: Negative.   Respiratory: Negative.    Cardiovascular: Negative.  Negative for leg swelling.  Gastrointestinal: Negative.   Endocrine: Negative.   Genitourinary: Negative.   Musculoskeletal:  Positive for back pain and neck pain.  Skin:  Positive for rash.    Past Medical History:  Diagnosis Date   Anxiety    Fibroid    Gastroesophageal reflux    GERD (gastroesophageal reflux disease)    Hyperlipidemia    Hypertension    IBS (irritable bowel syndrome)    Joint pain    Osteoarthritis    both knees   Prediabetes    Prediabetes     Past Surgical History:  Procedure Laterality Date   CESAREAN SECTION     x 2   EYE SURGERY Right    KNEE ARTHROSCOPY Right    KNEE SURGERY Right    removal of large cyst-non-cancerous   TONSILLECTOMY     UMBILICAL HERNIA REPAIR     WISDOM TOOTH EXTRACTION        Current Outpatient Medications:    cetirizine (ZYRTEC) 10 MG tablet, Take 1 tablet by mouth as needed., Disp: , Rfl:    COVID-19 mRNA vaccine 2023-2024 (COMIRNATY) syringe, Inject into the muscle., Disp: 0.3 mL, Rfl: 0   diphenhydrAMINE (BENADRYL) 25 mg capsule, Take 1 capsule by mouth as  needed., Disp: , Rfl:    linaclotide (LINZESS) 290 MCG CAPS capsule, Take 1 capsule (290 mcg total) by mouth daily before breakfast., Disp: 90 capsule, Rfl: 3   omeprazole (PRILOSEC) 20 MG capsule, TAKE 1 CAPSULE (20 MG TOTAL) BY MOUTH 2 (TWO) TIMES DAILY BEFORE A MEAL., Disp: 180 capsule, Rfl: 3   Objective:   Vitals:   11/22/21 1347  BP: 130/80  Pulse: 73  SpO2: 100%    Physical Exam Vitals and nursing note reviewed.  Constitutional:      Appearance: Normal appearance.  HENT:     Head: Normocephalic and atraumatic.  Cardiovascular:     Rate and Rhythm: Normal rate.     Pulses: Normal pulses.  Pulmonary:     Effort: Pulmonary effort is normal.  Abdominal:     General: There is no distension.     Palpations: Abdomen is soft. There is no mass.     Tenderness: There is no abdominal tenderness.     Hernia: No hernia is present.  Musculoskeletal:        General: No swelling or deformity.  Skin:    General: Skin is warm.     Capillary Refill: Capillary refill  takes less than 2 seconds.     Coloration: Skin is not jaundiced.     Findings: Rash present. No bruising or lesion.  Neurological:     Mental Status: She is alert and oriented to person, place, and time.  Psychiatric:        Mood and Affect: Mood normal.        Behavior: Behavior normal.        Thought Content: Thought content normal.        Judgment: Judgment normal.     Assessment & Plan:  Panniculitis  The patient is a candidate for a panniculectomy with possible add-on abdominoplasty.  We discussed the possibility of her losing her umbilicus so she is aware that that is possible.  We will submit to insurance and then follow-up afterwards once we hear back what insurance is willing to do.  Pictures were obtained of the patient and placed in the chart with the patient's or guardian's permission.   [place], DO

## 2021-11-30 ENCOUNTER — Other Ambulatory Visit (HOSPITAL_COMMUNITY): Payer: Self-pay

## 2022-01-13 ENCOUNTER — Other Ambulatory Visit (HOSPITAL_COMMUNITY): Payer: Self-pay

## 2022-01-21 ENCOUNTER — Other Ambulatory Visit: Payer: Self-pay | Admitting: Physician Assistant

## 2022-01-21 ENCOUNTER — Other Ambulatory Visit (HOSPITAL_COMMUNITY): Payer: Self-pay

## 2022-02-05 ENCOUNTER — Telehealth: Payer: Self-pay | Admitting: *Deleted

## 2022-02-05 NOTE — Telephone Encounter (Signed)
Denial received for panniculectomy. Patient states she plans to return in May 24 for new photos to resubmit to ins

## 2022-02-24 ENCOUNTER — Other Ambulatory Visit: Payer: Self-pay | Admitting: Physician Assistant

## 2022-02-24 ENCOUNTER — Other Ambulatory Visit: Payer: Self-pay

## 2022-02-24 ENCOUNTER — Other Ambulatory Visit (HOSPITAL_COMMUNITY): Payer: Self-pay

## 2022-02-24 MED ORDER — LINACLOTIDE 290 MCG PO CAPS
290.0000 ug | ORAL_CAPSULE | Freq: Every day | ORAL | 0 refills | Status: DC
  Filled 2022-02-24: qty 30, 30d supply, fill #0

## 2022-02-25 ENCOUNTER — Other Ambulatory Visit: Payer: Self-pay

## 2022-02-26 ENCOUNTER — Other Ambulatory Visit (HOSPITAL_COMMUNITY): Payer: Self-pay

## 2022-03-14 DIAGNOSIS — H524 Presbyopia: Secondary | ICD-10-CM | POA: Diagnosis not present

## 2022-03-24 ENCOUNTER — Other Ambulatory Visit: Payer: Self-pay | Admitting: Obstetrics and Gynecology

## 2022-03-24 DIAGNOSIS — Z1231 Encounter for screening mammogram for malignant neoplasm of breast: Secondary | ICD-10-CM

## 2022-03-26 ENCOUNTER — Ambulatory Visit
Admission: RE | Admit: 2022-03-26 | Discharge: 2022-03-26 | Disposition: A | Discharge status: Home / Self Care | Payer: Commercial Managed Care - PPO | Source: Ambulatory Visit | Attending: Obstetrics and Gynecology | Admitting: Obstetrics and Gynecology

## 2022-03-26 DIAGNOSIS — Z1231 Encounter for screening mammogram for malignant neoplasm of breast: Secondary | ICD-10-CM

## 2022-04-03 ENCOUNTER — Other Ambulatory Visit (HOSPITAL_COMMUNITY): Payer: Self-pay

## 2022-04-15 ENCOUNTER — Other Ambulatory Visit: Payer: Self-pay

## 2022-04-16 ENCOUNTER — Other Ambulatory Visit (HOSPITAL_BASED_OUTPATIENT_CLINIC_OR_DEPARTMENT_OTHER): Payer: Self-pay

## 2022-04-23 ENCOUNTER — Other Ambulatory Visit (HOSPITAL_BASED_OUTPATIENT_CLINIC_OR_DEPARTMENT_OTHER): Payer: Self-pay

## 2022-04-23 ENCOUNTER — Encounter: Payer: Self-pay | Admitting: Physician Assistant

## 2022-04-23 ENCOUNTER — Other Ambulatory Visit (HOSPITAL_COMMUNITY): Payer: Self-pay

## 2022-04-23 ENCOUNTER — Ambulatory Visit: Payer: Commercial Managed Care - PPO | Admitting: Physician Assistant

## 2022-04-23 VITALS — BP 126/84 | HR 65 | Ht 64.0 in | Wt 192.0 lb

## 2022-04-23 DIAGNOSIS — K219 Gastro-esophageal reflux disease without esophagitis: Secondary | ICD-10-CM

## 2022-04-23 DIAGNOSIS — K581 Irritable bowel syndrome with constipation: Secondary | ICD-10-CM | POA: Diagnosis not present

## 2022-04-23 DIAGNOSIS — Z1211 Encounter for screening for malignant neoplasm of colon: Secondary | ICD-10-CM

## 2022-04-23 MED ORDER — OMEPRAZOLE 20 MG PO CPDR
DELAYED_RELEASE_CAPSULE | Freq: Two times a day (BID) | ORAL | 3 refills | Status: DC
  Filled 2022-04-23: qty 180, 90d supply, fill #0
  Filled 2022-07-23: qty 180, 90d supply, fill #1
  Filled 2022-10-21: qty 180, 90d supply, fill #2
  Filled 2023-01-19: qty 180, 90d supply, fill #3

## 2022-04-23 MED ORDER — LINACLOTIDE 290 MCG PO CAPS
290.0000 ug | ORAL_CAPSULE | Freq: Every day | ORAL | 3 refills | Status: DC
Start: 2022-04-23 — End: 2023-04-15
  Filled 2022-04-23: qty 90, 90d supply, fill #0
  Filled 2022-07-23: qty 90, 90d supply, fill #1
  Filled 2022-10-21: qty 90, 90d supply, fill #2
  Filled 2023-01-19: qty 90, 90d supply, fill #3

## 2022-04-23 NOTE — Patient Instructions (Addendum)
You have been scheduled for a colonoscopy. Please follow written instructions given to you at your visit today.  Please pick up your prep supplies at the pharmacy within the next 1-3 days. If you use inhalers (even only as needed), please bring them with you on the day of your procedure.   We have sent the following medications to your pharmacy for you to pick up at your convenience: Omeprazole Linzess   Your pre visit is on 07/01/2022 at 9:00am , we will call you for this visit. _______________________________________________________  If your blood pressure at your visit was 140/90 or greater, please contact your primary care physician to follow up on this.  _______________________________________________________  If you are age 53 or older, your body mass index should be between 23-30. Your Body mass index is 32.96 kg/m. If this is out of the aforementioned range listed, please consider follow up with your Primary Care Provider.  If you are age 85 or younger, your body mass index should be between 19-25. Your Body mass index is 32.96 kg/m. If this is out of the aformentioned range listed, please consider follow up with your Primary Care Provider.   ________________________________________________________  The [place] [facility] providers would like to encourage you to use [facility] to communicate with providers for non-urgent requests or questions.  Due to long hold times on the telephone, sending your provider a message by [facility] may be a faster and more efficient way to get a response.  Please allow 48 business hours for a response.  Please remember that this is for non-urgent requests.  _______________________________________________________ It was a pleasure to see you today!  Thank you for trusting me with your gastrointestinal care!

## 2022-04-23 NOTE — Progress Notes (Signed)
Chief Complaint: Follow-up GERD and constipation  HPI:    [name] is a 62 year old African-American female with a past medical history of reflux and anxiety as well as IBS, known to Dr. Lavon Paganini, who presents to clinic today for follow-up of her reflux and constipation.   07/2012 colonoscopy with a 3 mm polyp removed from the ascending colon, path consistent with lymphoid aggregate no adenomatous changes.  Also small internal hemorrhoids.  Repeat recommended 10 years.    08/18/2019 patient seen in clinic by Doug Sou.  At that time was wanting a refill of her Omeprazole.  She was taking it 20 mg twice a day which worked well.  She also wanted to discuss her constipation again.  She was previously on Linzess 290 which was working well for her.  However she felt like it stopped working recently.  At that time discussed that she is started Phentermine since January which was thought to be contributing to her worsening constipation.  She wanted to retry Amitiza 24 mcg twice daily.  Her Omeprazole was refilled.  Discussed that she would be due for a colonoscopy in July 2024.    08/23/2019 patient called back and stated that she wanted to stay on the Linzess.    01/30/2021 office visit with me to refill her Omeprazole 20 twice daily and Linzess to 90 daily.       10/09/2021 CMP, CBC, TSH and hemoglobin A1c all normal.    Today, the patient presents to clinic and tells me that she is doing very well as long as she takes her Linzess 290 mcg daily and Omeprazole 20 twice daily.  She would like refills of this medication.  Also where she is coming up due for colonoscopy in July and would like to schedule.    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    Denies fever, chills, weight loss, abdominal pain, change in bowel habits, heartburn or reflux.     Past Medical History:  Diagnosis Date   Anxiety    Fibroid    Gastroesophageal reflux    GERD  (gastroesophageal reflux disease)    Hyperlipidemia    Hypertension    IBS (irritable bowel syndrome)    Joint pain    Osteoarthritis    both knees   Prediabetes    Prediabetes     Past Surgical History:  Procedure Laterality Date   CESAREAN SECTION     x 2   EYE SURGERY Right    KNEE ARTHROSCOPY Right    KNEE SURGERY Right    removal of large cyst-non-cancerous   TONSILLECTOMY     UMBILICAL HERNIA REPAIR     WISDOM TOOTH EXTRACTION      Current Outpatient Medications  Medication Sig Dispense Refill   cetirizine (ZYRTEC) 10 MG tablet Take 1 tablet by mouth as needed.     COVID-19 mRNA vaccine 2023-2024 (COMIRNATY) syringe Inject into the muscle. 0.3 mL 0   diphenhydrAMINE (BENADRYL) 25 mg capsule Take 1 capsule by mouth as needed.     linaclotide (LINZESS) 290 MCG CAPS capsule Take 1 capsule (290 mcg total) by mouth daily before breakfast. 30 capsule 0   omeprazole (PRILOSEC) 20 MG capsule TAKE 1 CAPSULE (20 MG TOTAL) BY MOUTH 2 (TWO) TIMES DAILY BEFORE A MEAL. 180 capsule 3   No current facility-administered medications for this visit.    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[employer]   Tobacco Use   Smoking status: Former    Packs/day: 1.00    Years: 26.00    Additional pack years: 0.00    Total pack years: 26.00    Types: Cigarettes   Smokeless tobacco: Never  Vaping Use   Vaping Use: Never used  Substance and  Sexual Activity   Alcohol use: No   Drug use: No   Sexual activity: Yes    Partners: Male    Birth control/protection: Post-menopausal  Other Topics Concern   Not on file  Social History Narrative   Not on file   Social Determinants of Health   Financial Resource Strain: Not on file  Food Insecurity: Not on file  Transportation Needs: Not on file  Physical Activity: Not on file  Stress: Not on file  Social Connections: Not on file  Intimate Partner Violence: Not on file    Review of Systems:    Constitutional: No weight loss, fever or chills Cardiovascular: No chest pain Respiratory: No SOB  Gastrointestinal: See HPI and otherwise negative   Physical Exam:  Vital signs: BP 126/84   Pulse 65   Ht 5\' 4"  (1.626 m)   Wt 192 lb (87.1 kg)   LMP 10/07/2014 (Approximate)   SpO2 95%   BMI 32.96 kg/m    Constitutional:   Pleasant AA female appears to be in NAD, Well developed, Well nourished, alert and cooperative Respiratory: Respirations even and unlabored. Lungs clear to auscultation bilaterally.   No wheezes, crackles, or rhonchi.  Cardiovascular: Normal S1, S2. No MRG. Regular rate and rhythm. No peripheral edema, cyanosis or pallor.  Gastrointestinal:  Soft, nondistended, nontender. No rebound or guarding. Normal bowel sounds. No appreciable masses or hepatomegaly. Rectal:  Not performed.  Psychiatric: Oriented to person, place and time. Demonstrates good judgement and reason without abnormal affect or behaviors.  RELEVANT LABS AND IMAGING: CBC    Component Value Date/Time   WBC 5.4 10/09/2021 1044   RBC 4.33 10/09/2021 1044   HGB 13.0 10/09/2021 1044   HGB 12.0 11/03/2019 1330   HCT 39.0 10/09/2021 1044   HCT 36.6 11/03/2019 1330   PLT 278.0 10/09/2021 1044   PLT 285 11/03/2019 1330   MCV 90.1 10/09/2021 1044   MCV 86 11/03/2019 1330   MCH 28.2 11/03/2019 1330   MCHC 33.4 10/09/2021 1044   RDW 13.8 10/09/2021 1044   RDW 14.4 11/03/2019 1330   LYMPHSABS 2.4  02/16/2018 1030   EOSABS 0.2 02/16/2018 1030   BASOSABS 0.0 02/16/2018 1030    CMP     Component Value Date/Time   NA 140 10/09/2021 1044   NA 143 11/03/2019 1330   K 3.7 10/09/2021 1044   CL 104 10/09/2021 1044   CO2 29 10/09/2021 1044   GLUCOSE 87 10/09/2021 1044   BUN 12 10/09/2021 1044   BUN 21 11/03/2019 1330  CREATININE 0.86 10/09/2021 1044   CALCIUM 9.3 10/09/2021 1044   PROT 6.8 10/09/2021 1044   PROT 6.7 11/03/2019 1330   ALBUMIN 4.4 10/09/2021 1044   ALBUMIN 4.0 11/03/2019 1330   AST 18 10/09/2021 1044   ALT 13 10/09/2021 1044   ALKPHOS 45 10/09/2021 1044   BILITOT 0.9 10/09/2021 1044   BILITOT 0.4 11/03/2019 1330   GFRNONAA 81 11/03/2019 1330   GFRAA 93 11/03/2019 1330    Assessment: 1.  GERD: Controlled on Omeprazole 20 mg twice daily 2.  IBS-C: Controlled on Linzess 290 mcg daily 3.  Screening for colorectal cancer: Patient is due in July for her screening colonoscopy  Plan: 1.  Patient scheduled for screening colonoscopy in the [facility] with Dr. Lavon Paganini in July.  Did provide patient with a detailed list of risks for the procedure and she agrees to proceed. Patient is appropriate for endoscopic procedure(s) in the ambulatory ([facility]) setting.  2.  Refilled Omeprazole 20 mg twice daily, 30-60 minutes before breakfast and dinner #180 with 3 refills 3.  Refilled Linzess 290 mcg daily 30 minutes before breakfast #90 with 3 refills 4. Patient to follow in clinic per recs after colonoscopy. Or in two years for more refills.  Hyacinth Meeker, PA-C [facility] 04/23/2022, 3:09 PM  Cc: Anne Ng, NP

## 2022-04-28 ENCOUNTER — Other Ambulatory Visit (HOSPITAL_BASED_OUTPATIENT_CLINIC_OR_DEPARTMENT_OTHER): Payer: Self-pay

## 2022-05-30 ENCOUNTER — Other Ambulatory Visit (HOSPITAL_COMMUNITY): Payer: Self-pay

## 2022-06-10 ENCOUNTER — Ambulatory Visit: Payer: Commercial Managed Care - PPO | Admitting: Family Medicine

## 2022-06-10 ENCOUNTER — Ambulatory Visit (INDEPENDENT_AMBULATORY_CARE_PROVIDER_SITE_OTHER): Payer: Commercial Managed Care - PPO

## 2022-06-10 ENCOUNTER — Telehealth: Payer: Self-pay

## 2022-06-10 ENCOUNTER — Other Ambulatory Visit: Payer: Self-pay

## 2022-06-10 VITALS — BP 132/88 | HR 70 | Ht 64.0 in | Wt 199.0 lb

## 2022-06-10 DIAGNOSIS — M25572 Pain in left ankle and joints of left foot: Secondary | ICD-10-CM | POA: Diagnosis not present

## 2022-06-10 DIAGNOSIS — M79662 Pain in left lower leg: Secondary | ICD-10-CM | POA: Diagnosis not present

## 2022-06-10 DIAGNOSIS — M7989 Other specified soft tissue disorders: Secondary | ICD-10-CM | POA: Diagnosis not present

## 2022-06-10 NOTE — Telephone Encounter (Signed)
Order revised. Scheduling note edited to reflect "no prior-auth required"

## 2022-06-10 NOTE — Telephone Encounter (Signed)
-----   Message from Evon Slack, [place] sent at 06/10/2022 10:42 AM EDT ----- Regarding: RE: vasc US Doesn't need a PA just put no PA needed in the order  ----- Message ----- From: Adron Bene Sent: 06/10/2022  10:35 AM EDT To: Evon Slack, CMA Subject: vasc US                                        Please prior-auth vasc US

## 2022-06-10 NOTE — Patient Instructions (Addendum)
Thank you for coming in today.   Please get an Xray today before you leave   You have a Vascular Ultrasound at:  [facility] at [facility] [address] [place], [place] [address] Phone: [phone]  Let me know how you are feeling

## 2022-06-10 NOTE — Progress Notes (Signed)
Rubin Payor, PhD, LAT, ATC acting as a scribe for Clementeen Graham, MD.  Elizabeth Brooks is a 62 y.o. female who presents to [facility] today for L LE pain. She was previously seen by Dr. Katrinka Blazing on 03/27/21 for L ankle pain.  Today, pt c/o L LE pain x 3 wks. No MOI. She works from home and getting ready to move. Pt locates pain to whole L foot, ankle and lower leg.  L LE swelling: yes Aggravates: TTP, wearing shoes, certain socks Treatments tried: elevation, Tylenol  Pertinent review of systems: No fevers or chills  Relevant historical information: GERD and IBS. No personal history DVT  Exam:  BP 132/88   Pulse 70   Ht 5\' 4"  (1.626 m)   Wt 199 lb (90.3 kg)   LMP 10/07/2014 (Approximate)   SpO2 93%   BMI 34.16 kg/m  General: Well Developed, well nourished, and in no acute distress.   MSK: Left leg swelling calf and foot compared to right. Calf is nontender. Foot and ankle tender palpation dorsal ankle and dorsal midfoot.  Normal foot and ankle motion.  Intact strength. Pulses cap refill and sensation are intact distally.    Lab and Radiology Results  Diagnostic Limited MSK Ultrasound of: Left foot and ankle Subcutaneous hypoechoic fluid tracking within subcutaneous tissue consistent with edema present.  No joint effusion or severe tenosynovitis or fracture is visible foot and ankle. Impression: Edema  X-ray images left foot and ankle obtained today personally and independently interpreted.  Plantar heel spur is present.  Left ankle: Rounded calcific density present at distal medial malleolus could represent old avulsion fracture.  Mild ankle degenerative changes are present.  Lateral talus bone spur is present.  Left foot: No acute fractures.  No severe degenerative changes.  Await formal radiology review    Assessment and Plan: 62 y.o. female with left leg swelling and discomfort.  She has unilateral lower extremity edema.  This is  concerning for DVT.  She does not have significant calf erythema or palpable cords or pain.  Plan for vascular ultrasound to evaluate for DVT.  Recommend compression sleeve.  If vascular ultrasound is negative for DVT would recommend a venous reflux study.   PDMP not reviewed this encounter. Orders Placed This Encounter  Procedures   [place] LIMITED JOINT SPACE STRUCTURES LOW LEFT(NO LINKED CHARGES)    Order Specific Question:   Reason for Exam (SYMPTOM  OR DIAGNOSIS REQUIRED)    Answer:   left ankle pain    Order Specific Question:   Preferred imaging location?    Answer:   [facility] Ankle Complete Left    Standing Status:   Future    Number of Occurrences:   1    Standing Expiration Date:   06/10/2023    Order Specific Question:   Reason for Exam (SYMPTOM  OR DIAGNOSIS REQUIRED)    Answer:   left ankle pain    Order Specific Question:   Preferred imaging location?    Answer:   [facility]    Order Specific Question:   Is patient pregnant?    Answer:   No   DG Foot Complete Left    Standing Status:   Future    Number of Occurrences:   1    Standing Expiration Date:   06/10/2023    Order Specific Question:   Reason for Exam (SYMPTOM  OR DIAGNOSIS REQUIRED)  Answer:   left foot pain    Order Specific Question:   Preferred imaging location?    Answer:   [facility]    Order Specific Question:   Is patient pregnant?    Answer:   No   No orders of the defined types were placed in this encounter.    Discussed warning signs or symptoms. Please see discharge instructions. Patient expresses understanding.   The above documentation has been reviewed and is accurate and complete Clementeen Graham, M.D.

## 2022-06-12 ENCOUNTER — Ambulatory Visit (HOSPITAL_COMMUNITY)
Admission: RE | Admit: 2022-06-12 | Discharge: 2022-06-12 | Disposition: A | Discharge status: Home / Self Care | Payer: Commercial Managed Care - PPO | Source: Ambulatory Visit | Attending: Cardiology | Admitting: Cardiology

## 2022-06-12 DIAGNOSIS — M7989 Other specified soft tissue disorders: Secondary | ICD-10-CM | POA: Diagnosis not present

## 2022-06-12 DIAGNOSIS — M79662 Pain in left lower leg: Secondary | ICD-10-CM | POA: Diagnosis not present

## 2022-06-12 NOTE — Progress Notes (Signed)
Vascular ultrasound shows no DVT present in the leg.

## 2022-06-14 ENCOUNTER — Encounter: Payer: Self-pay | Admitting: Family Medicine

## 2022-06-16 NOTE — Progress Notes (Signed)
Left foot x-ray shows a bone spur and arthritis at the big toe and a heel spur.Marland Kitchen

## 2022-06-16 NOTE — Progress Notes (Signed)
Left ankle and foot x-ray shows some concern for ankle impingement.  He also have some arthritis in the big toe and a heel spur.

## 2022-06-16 NOTE — Telephone Encounter (Signed)
Forwarding to Dr. Corey to review and advise.  

## 2022-06-17 ENCOUNTER — Other Ambulatory Visit: Payer: Self-pay

## 2022-06-17 DIAGNOSIS — M79662 Pain in left lower leg: Secondary | ICD-10-CM

## 2022-06-17 NOTE — Addendum Note (Signed)
Addended by: Debbe Odea R on: 06/17/2022 01:15 PM   Modules accepted: Orders

## 2022-06-27 ENCOUNTER — Ambulatory Visit (HOSPITAL_COMMUNITY)
Admission: RE | Admit: 2022-06-27 | Discharge: 2022-06-27 | Disposition: A | Discharge status: Home / Self Care | Payer: Commercial Managed Care - PPO | Source: Ambulatory Visit | Attending: Family Medicine | Admitting: Family Medicine

## 2022-06-27 DIAGNOSIS — M79662 Pain in left lower leg: Secondary | ICD-10-CM | POA: Insufficient documentation

## 2022-06-27 DIAGNOSIS — M7989 Other specified soft tissue disorders: Secondary | ICD-10-CM

## 2022-06-30 NOTE — Progress Notes (Signed)
Venous reflux study is normal.  No venous reflux is present.

## 2022-07-01 ENCOUNTER — Other Ambulatory Visit (HOSPITAL_BASED_OUTPATIENT_CLINIC_OR_DEPARTMENT_OTHER): Payer: Self-pay

## 2022-07-01 ENCOUNTER — Encounter: Payer: Self-pay | Admitting: Family Medicine

## 2022-07-01 ENCOUNTER — Encounter: Payer: Self-pay | Admitting: Gastroenterology

## 2022-07-01 ENCOUNTER — Ambulatory Visit (AMBULATORY_SURGERY_CENTER): Payer: Commercial Managed Care - PPO

## 2022-07-01 VITALS — Ht 64.0 in | Wt 196.0 lb

## 2022-07-01 DIAGNOSIS — Z1211 Encounter for screening for malignant neoplasm of colon: Secondary | ICD-10-CM

## 2022-07-01 MED ORDER — NA SULFATE-K SULFATE-MG SULF 17.5-3.13-1.6 GM/177ML PO SOLN
1.0000 | Freq: Once | ORAL | 0 refills | Status: AC
  Filled 2022-07-01: qty 354, 1d supply, fill #0

## 2022-07-01 NOTE — Telephone Encounter (Signed)
Forwarding to Dr. Corey to review and advise.  

## 2022-07-01 NOTE — Progress Notes (Signed)
No egg or soy allergy known to patient  No issues known to pt with past sedation with any surgeries or procedures Patient denies ever being told they had issues or difficulty with intubation  No FH of Malignant Hyperthermia Pt is not on diet pills Pt is not on  home 02  Pt is not on blood thinners  Pt Linzess issues with constipation  No A fib or A flutter Have any cardiac testing pending--no Pt instructed to use [email or web address] or GoodRx for a price reduction on prep  Patient's chart reviewed by Cathlyn Parsons CNRA prior to previsit and patient appropriate for the [facility].  Previsit completed and red dot placed by patient's name on their procedure day (on provider's schedule).   Can ambulate independently

## 2022-07-02 ENCOUNTER — Other Ambulatory Visit (HOSPITAL_BASED_OUTPATIENT_CLINIC_OR_DEPARTMENT_OTHER): Payer: Self-pay

## 2022-07-03 ENCOUNTER — Other Ambulatory Visit (HOSPITAL_BASED_OUTPATIENT_CLINIC_OR_DEPARTMENT_OTHER): Payer: Self-pay

## 2022-07-15 ENCOUNTER — Encounter: Payer: Commercial Managed Care - PPO | Admitting: Gastroenterology

## 2022-07-23 ENCOUNTER — Other Ambulatory Visit (HOSPITAL_COMMUNITY): Payer: Self-pay

## 2022-08-05 ENCOUNTER — Encounter: Payer: Self-pay | Admitting: Gastroenterology

## 2022-08-05 ENCOUNTER — Ambulatory Visit (AMBULATORY_SURGERY_CENTER): Payer: Commercial Managed Care - PPO | Admitting: Gastroenterology

## 2022-08-05 VITALS — BP 127/76 | HR 61 | Temp 97.8°F | Resp 14 | Ht 64.0 in | Wt 196.0 lb

## 2022-08-05 DIAGNOSIS — Z1211 Encounter for screening for malignant neoplasm of colon: Secondary | ICD-10-CM

## 2022-08-05 DIAGNOSIS — R7303 Prediabetes: Secondary | ICD-10-CM | POA: Diagnosis not present

## 2022-08-05 DIAGNOSIS — I10 Essential (primary) hypertension: Secondary | ICD-10-CM | POA: Diagnosis not present

## 2022-08-05 DIAGNOSIS — F419 Anxiety disorder, unspecified: Secondary | ICD-10-CM | POA: Diagnosis not present

## 2022-08-05 MED ORDER — SODIUM CHLORIDE 0.9 % IV SOLN: 500.0000 mL | Freq: Once | INTRAVENOUS | Status: DC

## 2022-08-05 NOTE — Progress Notes (Unsigned)
VS completed by EP   Pt's states no medical or surgical changes since previsit or office visit.

## 2022-08-05 NOTE — Progress Notes (Unsigned)
[facility] History and Physical   Primary Care Physician:  [name], Bonna Gains, NP   Reason for Procedure:  Colorectal cancer screening  Plan:    Screening colonoscopy with possible interventions as needed     HPI: Elizabeth Brooks is a very pleasant 62 y.o. female here for screening colonoscopy. Denies any nausea, vomiting, abdominal pain, melena or bright red blood per rectum  The risks and benefits as well as alternatives of endoscopic procedure(s) have been discussed and reviewed. All questions answered. The patient agrees to proceed.    Past Medical History:  Diagnosis Date   Anxiety    Fibroid    Gastroesophageal reflux    GERD (gastroesophageal reflux disease)    Hyperlipidemia    Hypertension    IBS (irritable bowel syndrome)    Joint pain    Osteoarthritis    both knees   Prediabetes    Prediabetes     Past Surgical History:  Procedure Laterality Date   CESAREAN SECTION     x 2   COLONOSCOPY     EYE SURGERY Right    KNEE ARTHROSCOPY Right    KNEE SURGERY Right    removal of large cyst-non-cancerous   TONSILLECTOMY     UMBILICAL HERNIA REPAIR     WISDOM TOOTH EXTRACTION      Prior to Admission medications   Medication Sig Start Date End Date Taking? Authorizing Provider  acetaminophen (TYLENOL) 650 MG CR tablet Take 650 mg by mouth every 8 (eight) hours as needed for pain.   Yes [provider]  caffeine 200 MG TABS tablet Take 200 mg by mouth.   Yes [provider]  linaclotide (LINZESS) 290 MCG CAPS capsule Take 1 capsule (290 mcg total) by mouth daily. 04/23/22  Yes Unk Lightning, PA  Multiple Vitamin (MULTIVITAMIN ADULT PO) Take by mouth.   Yes [provider]  omeprazole (PRILOSEC) 20 MG capsule TAKE 1 CAPSULE (20 MG TOTAL) BY MOUTH 2 (TWO) TIMES DAILY BEFORE A MEAL. 04/23/22  Yes Unk Lightning, PA  COVID-19 mRNA vaccine [phone] (COMIRNATY) syringe Inject into the muscle. Patient not  taking: Reported on 07/01/2022 10/29/21   Judyann Munson, MD  naproxen (NAPROSYN) 250 MG tablet Take by mouth 2 (two) times daily with a meal.    [provider]    Current Outpatient Medications  Medication Sig Dispense Refill   acetaminophen (TYLENOL) 650 MG CR tablet Take 650 mg by mouth every 8 (eight) hours as needed for pain.     caffeine 200 MG TABS tablet Take 200 mg by mouth.     linaclotide (LINZESS) 290 MCG CAPS capsule Take 1 capsule (290 mcg total) by mouth daily. 90 capsule 3   Multiple Vitamin (MULTIVITAMIN ADULT PO) Take by mouth.     omeprazole (PRILOSEC) 20 MG capsule TAKE 1 CAPSULE (20 MG TOTAL) BY MOUTH 2 (TWO) TIMES DAILY BEFORE A MEAL. 180 capsule 3   COVID-19 mRNA vaccine 2023-2024 (COMIRNATY) syringe Inject into the muscle. (Patient not taking: Reported on 07/01/2022) 0.3 mL 0   naproxen (NAPROSYN) 250 MG tablet Take by mouth 2 (two) times daily with a meal.     Current Facility-Administered Medications  Medication Dose Route Frequency Provider Last Rate Last Admin   0.9 %  sodium chloride infusion  500 mL Intravenous Once Napoleon Form, MD        Allergies as of 08/05/2022 - Review Complete 08/05/2022  Allergen Reaction Noted   Penicillins Anaphylaxis and Rash  02/13/2010    Family History  Problem Relation Age of Onset   Hyperlipidemia Mother    Hypertension Mother    Diabetes Mother    Dementia Mother 58   Stroke Sister    Heart disease Maternal Grandmother    Colon cancer Neg Hx    Colon polyps Neg Hx    Esophageal cancer Neg Hx    Rectal cancer Neg Hx    Stomach cancer Neg Hx     Social History   Socioeconomic History   Marital status: Married    Spouse name: Zariyah Lerman   Number of children: 2   Years of education: Not on file   Highest education level: Not on file  Occupational History   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[employer]   Tobacco Use   Smoking status: Former    Current packs/day: 1.00    Average packs/day: 1 pack/day for  26.0 years (26.0 ttl pk-yrs)    Types: Cigarettes   Smokeless tobacco: Never  Vaping Use   Vaping status: Never Used  Substance and Sexual Activity   Alcohol use: No   Drug use: No   Sexual activity: Yes    Partners: Male    Birth control/protection: Post-menopausal  Other Topics Concern   Not on file  Social History Narrative   Not on file   Social Determinants of Health   Financial Resource Strain: Not on file  Food Insecurity: Not on file  Transportation Needs: Not on file  Physical Activity: Not on file  Stress: Not on file  Social Connections: Not on file  Intimate Partner Violence: Not on file    Review of Systems:  All other review of systems negative except as mentioned in the HPI.  Physical Exam: Vital signs in last 24 hours: BP (!) 145/93   Pulse 67   Temp 97.8 F (36.6 C) (Temporal)   Ht 5\' 4"  (1.626 m)   Wt 196 lb (88.9 kg)   LMP 10/07/2014 (Approximate)   SpO2 97%   BMI 33.64 kg/m  General:   Alert, NAD Lungs:  Clear .   Heart:  Regular rate and rhythm Abdomen:  Soft, nontender and nondistended. Neuro/Psych:  Alert and cooperative. Normal mood and affect. A and O x 3  Reviewed labs, radiology imaging, old records and pertinent past GI work up  Patient is appropriate for planned procedure(s) and anesthesia in an ambulatory setting   K. Scherry Ran , MD [phone]

## 2022-08-05 NOTE — Op Note (Signed)
[facility] Patient Name: Elizabeth Brooks Procedure Date: 08/05/2022 2:43 PM MRN: [id number] Endoscopist: Napoleon Form , MD, [id number] Age: 62 Referring MD:  Date of Birth: Jan 24, 1960 Gender: Female Account #: [id number] Procedure:                Colonoscopy Indications:              Screening for colorectal malignant neoplasm Medicines:                Monitored Anesthesia Care Procedure:                Pre-Anesthesia Assessment:                           - Prior to the procedure, a History and Physical                            was performed, and patient medications and                            allergies were reviewed. The patient's tolerance of                            previous anesthesia was also reviewed. The risks                            and benefits of the procedure and the sedation                            options and risks were discussed with the patient.                            All questions were answered, and informed consent                            was obtained. ASA Grade Assessment: II - A patient                            with mild systemic disease. After reviewing the                            risks and benefits, the patient was deemed in                            satisfactory condition to undergo the procedure.                           After obtaining informed consent, the colonoscope                            was passed under direct vision. Throughout the                            procedure, the patient's blood pressure, pulse, and  oxygen saturations were monitored continuously. The                            Olympus PCF-H190DL (#[id number]) Colonoscope was                            introduced through the anus and advanced to the the                            cecum, identified by appendiceal orifice and                            ileocecal valve. The colonoscopy was performed                             without difficulty. The patient tolerated the                            procedure well. The quality of the bowel                            preparation was good. The ileocecal valve,                            appendiceal orifice, and rectum were photographed. Scope In: 2:54:17 PM Scope Out: 3:08:26 PM Scope Withdrawal Time: 0 hours 7 minutes 18 seconds  Total Procedure Duration: 0 hours 14 minutes 9 seconds  Findings:                 The perianal and digital rectal examinations were                            normal.                           A few small-mouthed diverticula were found in the                            sigmoid colon.                           Non-bleeding external and internal hemorrhoids were                            found during retroflexion. The hemorrhoids were                            medium-sized. Complications:            No immediate complications. Estimated Blood Loss:     Estimated blood loss was minimal. Impression:               - Diverticulosis in the sigmoid colon.                           - Non-bleeding external and internal hemorrhoids.                           -  No specimens collected. Recommendation:           - Patient has a contact number available for                            emergencies. The signs and symptoms of potential                            delayed complications were discussed with the                            patient. Return to normal activities tomorrow.                            Written discharge instructions were provided to the                            patient.                           - Resume previous diet.                           - Continue present medications.                           - Repeat colonoscopy in 10 years for screening                            purposes. Napoleon Form, MD 08/05/2022 3:12:14 PM This report has been signed electronically.

## 2022-08-05 NOTE — Patient Instructions (Signed)
Handout on diverticulosis given.    YOU HAD AN ENDOSCOPIC PROCEDURE TODAY AT THE [facility]:   Refer to the procedure report that was given to you for any specific questions about what was found during the examination.  If the procedure report does not answer your questions, please call your gastroenterologist to clarify.  If you requested that your care partner not be given the details of your procedure findings, then the procedure report has been included in a sealed envelope for you to review at your convenience later.  YOU SHOULD EXPECT: Some feelings of bloating in the abdomen. Passage of more gas than usual.  Walking can help get rid of the air that was put into your GI tract during the procedure and reduce the bloating. If you had a lower endoscopy (such as a colonoscopy or flexible sigmoidoscopy) you may notice spotting of blood in your stool or on the toilet paper. If you underwent a bowel prep for your procedure, you may not have a normal bowel movement for a few days.  Please Note:  You might notice some irritation and congestion in your nose or some drainage.  This is from the oxygen used during your procedure.  There is no need for concern and it should clear up in a day or so.  SYMPTOMS TO REPORT IMMEDIATELY:  Following lower endoscopy (colonoscopy or flexible sigmoidoscopy):  Excessive amounts of blood in the stool  Significant tenderness or worsening of abdominal pains  Swelling of the abdomen that is new, acute  Fever of 100F or higher   For urgent or emergent issues, a gastroenterologist can be reached at any hour by calling [phone]. Do not use MyChart messaging for urgent concerns.    DIET:  We do recommend a small meal at first, but then you may proceed to your regular diet.  Drink plenty of fluids but you should avoid alcoholic beverages for 24 hours.  ACTIVITY:  You should plan to take it easy for the rest of today and you should NOT DRIVE or use  heavy machinery until tomorrow (because of the sedation medicines used during the test).    FOLLOW UP: Our staff will call the number listed on your records the next business day following your procedure.  We will call around 7:15- 8:00 am to check on you and address any questions or concerns that you may have regarding the information given to you following your procedure. If we do not reach you, we will leave a message.     If any biopsies were taken you will be contacted by phone or by letter within the next 1-3 weeks.  Please call us at [phone] if you have not heard about the biopsies in 3 weeks.    SIGNATURES/CONFIDENTIALITY: You and/or your care partner have signed paperwork which will be entered into your electronic medical record.  These signatures attest to the fact that that the information above on your After Visit Summary has been reviewed and is understood.  Full responsibility of the confidentiality of this discharge information lies with you and/or your care-partner.  

## 2022-08-05 NOTE — Progress Notes (Unsigned)
Uneventful anesthetic. Report to pacu rn. Vss. Care resumed by rn. 

## 2022-08-06 ENCOUNTER — Telehealth: Payer: Self-pay

## 2022-08-06 NOTE — Telephone Encounter (Signed)
  Follow up Call-     08/05/2022    2:17 PM  Call back number  Post procedure Call Back phone  # [phone]  Permission to leave phone message Yes     Patient questions:  Do you have a fever, pain , or abdominal swelling? No. Pain Score  0 *  Have you tolerated food without any problems? Yes.    Have you been able to return to your normal activities? Yes.    Do you have any questions about your discharge instructions: Diet   No. Medications  No. Follow up visit  No.  Do you have questions or concerns about your Care? No.  Actions: * If pain score is 4 or above: No action needed, pain <4.

## 2022-08-20 ENCOUNTER — Encounter: Payer: Self-pay | Admitting: Podiatry

## 2022-08-20 ENCOUNTER — Ambulatory Visit: Payer: Commercial Managed Care - PPO | Admitting: Podiatry

## 2022-08-20 ENCOUNTER — Ambulatory Visit: Payer: Commercial Managed Care - PPO

## 2022-08-20 DIAGNOSIS — L600 Ingrowing nail: Secondary | ICD-10-CM | POA: Diagnosis not present

## 2022-08-20 DIAGNOSIS — S8252XS Displaced fracture of medial malleolus of left tibia, sequela: Secondary | ICD-10-CM

## 2022-08-20 DIAGNOSIS — M7989 Other specified soft tissue disorders: Secondary | ICD-10-CM

## 2022-08-20 NOTE — Progress Notes (Signed)
Subjective:  Patient ID: Elizabeth Brooks, female    DOB: December 09, 1960,  MRN: [id number]  Chief Complaint  Patient presents with   Foot Pain    "I have swelling and tingling in my left foot." N - edema and tingling L - dorsal and lateral ankle left D - March O - suddenly, about the same C - swelling and tingling A - standing for a long period of time T - Dr. Clementeen Graham and Dr. Harold Hedge, xrays, vascular ultrasounds    62 y.o. female presents with the above complaint. History confirmed with patient.  She has had a difficult history with his left ankle, approximately 1 and half years ago she says she  was having nerve pain and was treated by Dr Katrinka Blazing at [facility] with boot and physical therapy exercises.  Ultrasounds and MRI were completed at that time.  Pain had improved until about 6 months ago when she started having persistent continued swelling in the left ankle and into the leg nearly up to the knee.  She has tried wearing compression stockings, cannot tolerate higher pressure due to discomfort and makes with tingling in her toes and numbness feeling, elevation does help some but does not rapidly decompress the swelling.  She has had new ultrasounds and x-rays completed as well this year by Dr. Denyse Amass.  She has also completed a DVT venous ultrasound as well as a reflux study both of which were negative.  As far as she knows no personal or significant family history for RA lupus or other inflammatory arthritides.  She states that she does not have significant pain while walking at this point.  She does have a sedentary type job and her feet are in a dependent position quite often.  Unrelated has a previous history of ingrown toenail treated on the right with some regrowth of nail plate and left medial nail thickening and causing discomfort in the corner and is difficult to cut with discoloration.  Objective:  Physical Exam: warm, good capillary refill, no trophic changes or  ulcerative lesions, normal DP and PT pulses, normal sensory exam, and she does have nonpitting edema throughout the left lower extremity distal to the knee, mostly centered around the gaiter area and lower ankle, no notable varicosities, does not have pain with compression of the tarsal tunnel, no pain along the PT tendon, she does have good dorsiflexion plantarflexion and inversion eversion strength.  No pain on ankle or midfoot joint lines or with range of motion which is normal and intact.  Dystrophic distal left hallux nail medial border with longitudinal discoloration   Radiographs completed 06/10/2022: DG Ankle Complete Left (Accession [id number]) (Order [id number]) Imaging Date: 06/10/2022 Department: [facility] Released By: Ronalee Red, RT Authorizing: Rodolph Bong, MD   Exam Status  Status  Final [99]   PACS Intelerad Image Link   Show images for DG Ankle Complete Left Related Results   DG Foot Complete Left Final result 06/10/2022 10:38 AM    Narrative  CLINICAL DATA:  3 weeks of left ankle and foot pain.  EXAM: LEFT ANKLE COMPLETE - 3+ VIEW; LEFT FOOT - COMPLETE 3+ VIEW  COMPARISON:  Left ankle radiographs 01/23/2021  FINDINGS: Left ankle:  ...       Study Result  Narrative & Impression  CLINICAL DATA:  3 weeks of left ankle and foot pain.   EXAM: LEFT ANKLE COMPLETE - 3+ VIEW; LEFT FOOT - COMPLETE 3+ VIEW  COMPARISON:  Left ankle radiographs 01/23/2021   FINDINGS: Left ankle:   Moderate laterally directed osteophytosis at the lateral talar process, distal to the fibula. There are two 0 corticated 6 mm chronic ossicles just distal to the medial malleolus. Moderate plantar calcaneal heel spur. Small tiny chronic ossicle at the dorsal aspect of the talonavicular joint with mild distal dorsal talar neck degenerative spurring.   Moderate lateral malleolar soft tissue swelling, increased from      Diagnostic Limited MSK  Ultrasound of: Left foot and ankle Subcutaneous hypoechoic fluid tracking within subcutaneous tissue consistent with edema present.  No joint effusion or severe tenosynovitis or fracture is visible foot and ankle. Impression: Edema  LE DVT US negative on 06/12/2022 for any thrombus LE Reflux [place] negative on 06/27/2022 for any significant reflux Assessment:   1. Ingrowing right great toenail   2. Closed avulsion fracture of medial malleolus of left tibia, sequela   3. Swelling of lower extremity   4. Ingrowing left great toenail      Plan:  Patient was evaluated and treated and all questions answered.  I reviewed her clinical history as well as my exam findings with the patient and her husband who was available by phone.  Separately I also reviewed the notes and images from radiographs, ultrasound MRI from Dr. Katrinka Blazing and Dr. Denyse Amass.  We discussed the presence of the chronic swelling and edema and discussed that typically most lower extremity edema is not unilateral.  We discussed possible etiologies for this, does not have any notable joint or tendon pain instability or weakness.  Large I am doubtful that there is a primary etiology for this within the foot and ankle itself.  I recommended evaluating with further lab work for any inflammatory condition such as lupus and rheumatoid arthritis, multiple labs were ordered today and she will complete these at [facility] and I will let her know what the results of the show.  Pending results may refer to rheumatology.  We also discussed further venous etiologies that are not showing on the reflux study including May Thurner syndrome, could refer to vascular and vein surgeon if the lab work is unrevealing.  I also discussed with her that if her pain returns especially with activity that I would like to repeat MRI to evaluate further there are ossicles from an old injury at the level of the medial malleolus and these could be impinging on the posterior tibial tendon  but I would expect these to be painful in addition to the swelling which they currently are not.  We also discussed etiology of nail dystrophy and ingrowing nails, I recommended culture of the nail plate the nail was debrided in a slant back fashion to alleviate the offending border any subungual debris and nail plate was sent for pathology to [facility] I will let her know with the results of the show.  No follow-ups on file.

## 2022-08-22 ENCOUNTER — Other Ambulatory Visit (HOSPITAL_COMMUNITY): Payer: Self-pay

## 2022-08-28 LAB — SEDIMENTATION RATE: Sed Rate: 4 mm/h (ref 0–40)

## 2022-08-28 LAB — ANTI-CCP AB, IGG + IGA (RDL): Anti-CCP Ab, IgG + IgA (RDL): <20 U (ref <20)

## 2022-08-28 LAB — RHEUMATOID FACTOR: Rheumatoid fact SerPl-aCnc: <10 [IU]/mL (ref <14.0)

## 2022-08-28 LAB — ANA: ANA Titer 1: NEGATIVE

## 2022-08-28 LAB — URIC ACID: Uric Acid: 4.4 mg/dL (ref 3.0–7.2)

## 2022-08-29 ENCOUNTER — Other Ambulatory Visit: Payer: Self-pay | Admitting: Medical Genetics

## 2022-08-29 DIAGNOSIS — Z006 Encounter for examination for normal comparison and control in clinical research program: Secondary | ICD-10-CM

## 2022-09-02 NOTE — Addendum Note (Signed)
Addended byLilian Kapur, Latrecia Capito R on: 09/02/2022 05:16 PM   Modules accepted: Orders

## 2022-09-11 ENCOUNTER — Encounter: Payer: Self-pay | Admitting: Podiatry

## 2022-10-21 ENCOUNTER — Other Ambulatory Visit (HOSPITAL_COMMUNITY): Payer: Self-pay

## 2022-10-23 ENCOUNTER — Encounter: Payer: Self-pay | Admitting: Podiatry

## 2022-10-27 ENCOUNTER — Ambulatory Visit (INDEPENDENT_AMBULATORY_CARE_PROVIDER_SITE_OTHER): Payer: Commercial Managed Care - PPO | Admitting: Vascular Surgery

## 2022-10-27 ENCOUNTER — Encounter (INDEPENDENT_AMBULATORY_CARE_PROVIDER_SITE_OTHER): Payer: Self-pay | Admitting: Vascular Surgery

## 2022-10-27 VITALS — BP 114/79 | HR 84 | Resp 16 | Wt 203.0 lb

## 2022-10-27 DIAGNOSIS — K219 Gastro-esophageal reflux disease without esophagitis: Secondary | ICD-10-CM | POA: Diagnosis not present

## 2022-10-27 DIAGNOSIS — I890 Lymphedema, not elsewhere classified: Secondary | ICD-10-CM

## 2022-10-27 DIAGNOSIS — E785 Hyperlipidemia, unspecified: Secondary | ICD-10-CM

## 2022-10-27 NOTE — Progress Notes (Signed)
MRN : [id number]  Elizabeth Brooks is a 62 y.o. ([birth year]) female who presents with chief complaint of legs swell.  History of Present Illness:   Patient is seen for evaluation of leg swelling. The patient first noticed the swelling remotely but is now concerned because of a significant increase in the overall edema. The swelling isn't associated with significant pain.  There has been an increasing amount of  discoloration noted by the patient. The patient notes that in the morning the legs are improved but they steadily worsened throughout the course of the day. Elevation seems to make the swelling of the legs better, dependency makes them much worse.   There is no history of ulcerations associated with the swelling.   The patient denies any recent changes in their medications.  The patient has not been wearing graduated compression.  The patient has no had any past angiography, interventions or vascular surgery.  The patient denies a history of DVT or PE. There is no prior history of phlebitis. There is no history of primary lymphedema.  There is no history of radiation treatment to the groin or pelvis No history of malignancies. No history of trauma or groin or pelvic surgery. No history of foreign travel or parasitic infections area   No outpatient medications have been marked as taking for the 10/27/22 encounter (Appointment) with Gilda Crease, Latina Craver, MD.    Past Medical History:  Diagnosis Date   Anxiety    Fibroid    Gastroesophageal reflux    GERD (gastroesophageal reflux disease)    Hyperlipidemia    Hypertension    IBS (irritable bowel syndrome)    Joint pain    Osteoarthritis    both knees   Prediabetes    Prediabetes     Past Surgical History:  Procedure Laterality Date   CESAREAN SECTION     x 2   COLONOSCOPY     EYE SURGERY Right    KNEE ARTHROSCOPY Right    KNEE SURGERY Right    removal of large cyst-non-cancerous    TONSILLECTOMY     UMBILICAL HERNIA REPAIR     WISDOM TOOTH EXTRACTION      Social History Social History   Tobacco Use   Smoking status: Former    Current packs/day: 1.00    Average packs/day: 1 pack/day for 26.0 years (26.0 ttl pk-yrs)    Types: Cigarettes   Smokeless tobacco: Never  Vaping Use   Vaping status: Never Used  Substance Use Topics   Alcohol use: No   Drug use: No    Family History Family History  Problem Relation Age of Onset   Hyperlipidemia Mother    Hypertension Mother    Diabetes Mother    Dementia Mother 104   Stroke Sister    Heart disease Maternal Grandmother    Colon cancer Neg Hx    Colon polyps Neg Hx    Esophageal cancer Neg Hx    Rectal cancer Neg Hx    Stomach cancer Neg Hx     Allergies  Allergen Reactions   Penicillins Anaphylaxis and Rash     REVIEW OF SYSTEMS (Negative unless checked)  Constitutional: [] Weight loss  [] Fever  [] Chills Cardiac: [] Chest pain   [] Chest pressure   [] Palpitations   [] Shortness of breath when laying flat   [] Shortness of  breath with exertion. Vascular:  [] Pain in legs with walking   [x] Pain in legs with standing  [] History of DVT   [] Phlebitis   [x] Swelling in legs   [] Varicose veins   [] Non-healing ulcers Pulmonary:   [] Uses home oxygen   [] Productive cough   [] Hemoptysis   [] Wheeze  [] COPD   [] Asthma Neurologic:  [] Dizziness   [] Seizures   [] History of stroke   [] History of TIA  [] Aphasia   [] Vissual changes   [] Weakness or numbness in arm   [] Weakness or numbness in leg Musculoskeletal:   [] Joint swelling   [x] Joint pain   [] Low back pain Hematologic:  [] Easy bruising  [] Easy bleeding   [] Hypercoagulable state   [] Anemic Gastrointestinal:  [] Diarrhea   [] Vomiting  [x] Gastroesophageal reflux/heartburn   [] Difficulty swallowing. Genitourinary:  [] Chronic kidney disease   [] Difficult urination  [] Frequent urination   [] Blood in urine Skin:  [] Rashes   [] Ulcers  Psychological:  [] History of anxiety   []   History of major depression.  Physical Examination  There were no vitals filed for this visit. There is no height or weight on file to calculate BMI. Gen: WD/WN, NAD Head: [place]/AT, No temporalis wasting.  Ear/Nose/Throat: Hearing grossly intact, nares w/o erythema or drainage, pinna without lesions Eyes: PER, EOMI, sclera nonicteric.  Neck: Supple, no gross masses.  No JVD.  Pulmonary:  Good air movement, no audible wheezing, no use of accessory muscles.  Cardiac: RRR, precordium not hyperdynamic. Vascular:  scattered varicosities present bilaterally.  Mild venous stasis changes to the legs bilaterally.  3-4+ soft pitting edema, CEAP C4sEpAsPr  Vessel Right Left  Radial Palpable Palpable  Gastrointestinal: soft, non-distended. No guarding/no peritoneal signs.  Musculoskeletal: M/S 5/5 throughout.  No deformity.  Neurologic: CN 2-12 intact. Pain and light touch intact in extremities.  Symmetrical.  Speech is fluent. Motor exam as listed above. Psychiatric: Judgment intact, Mood & affect appropriate for pt's clinical situation. Dermatologic: Venous rashes no ulcers noted.  No changes consistent with cellulitis. Lymph : No lichenification or skin changes of chronic lymphedema.  CBC Lab Results  Component Value Date   WBC 5.4 10/09/2021   HGB 13.0 10/09/2021   HCT 39.0 10/09/2021   MCV 90.1 10/09/2021   PLT 278.0 10/09/2021    BMET    Component Value Date/Time   NA 140 10/09/2021 1044   NA 143 11/03/2019 1330   K 3.7 10/09/2021 1044   CL 104 10/09/2021 1044   CO2 29 10/09/2021 1044   GLUCOSE 87 10/09/2021 1044   BUN 12 10/09/2021 1044   BUN 21 11/03/2019 1330   CREATININE 0.86 10/09/2021 1044   CALCIUM 9.3 10/09/2021 1044   GFRNONAA 81 11/03/2019 1330   GFRAA 93 11/03/2019 1330   CrCl cannot be calculated (Patient's most recent lab result is older than the maximum 21 days allowed.).  COAG No results found for: "INR", "PROTIME"  Radiology No results  found.   Assessment/Plan 1. Lymphedema Recommend:  No surgery or intervention at this point in time.   The Patient is CEAP C4sEpAsPr.  The patient has been wearing compression for more than 12 weeks with no or little benefit.  The patient has been exercising daily for more than 12 weeks. The patient has been elevating and taking OTC pain medications for more than 12 weeks.  None of these have have eliminated the pain related to the lymphedema or the discomfort regarding excessive swelling and venous congestion.    I have reviewed my discussion with the patient  regarding lymphedema and why it  causes symptoms.  Patient will continue wearing graduated compression on a daily basis. The patient should put the compression on first thing in the morning and removing them in the evening. The patient should not sleep in the compression.   In addition, behavioral modification throughout the day will be continued.  This will include frequent elevation (such as in a recliner), use of over the counter pain medications as needed and exercise such as walking.  The systemic causes for chronic edema such as liver, kidney and cardiac etiologies do not appear to have significant changed over the past year.    The patient has chronic , severe lymphedema with hyperpigmentation of the skin and has done MLD, skin care, medication, diet, exercise, elevation and compression for 4 weeks with no improvement,  I am recommending a lymphedema pump.  The patient still has stage 3 lymphedema and therefore, I believe that a lymph pump is needed to improve the control of the patient's lymphedema and improve the quality of life.  Additionally, a lymph pump is warranted because it will reduce the risk of cellulitis and ulceration in the future.  Patient should follow-up in six months   2. Dyslipidemia Continue statin as ordered and reviewed, no changes at this time  3. Gastroesophageal reflux disease without  esophagitis Continue PPI as already ordered, this medication has been reviewed and there are no changes at this time.  Avoidence of caffeine and alcohol  Moderate elevation of the head of the bed     Levora Dredge, MD  10/27/2022 9:27 AM

## 2022-11-06 ENCOUNTER — Other Ambulatory Visit: Payer: Commercial Managed Care - PPO

## 2022-11-09 ENCOUNTER — Encounter (INDEPENDENT_AMBULATORY_CARE_PROVIDER_SITE_OTHER): Payer: Self-pay | Admitting: Vascular Surgery

## 2022-11-18 ENCOUNTER — Other Ambulatory Visit: Payer: Commercial Managed Care - PPO

## 2022-11-19 ENCOUNTER — Other Ambulatory Visit
Admission: RE | Admit: 2022-11-19 | Discharge: 2022-11-19 | Disposition: A | Discharge status: Home / Self Care | Payer: Self-pay | Source: Ambulatory Visit | Attending: Medical Genetics | Admitting: Medical Genetics

## 2022-11-19 ENCOUNTER — Other Ambulatory Visit: Payer: Commercial Managed Care - PPO

## 2022-11-19 DIAGNOSIS — Z006 Encounter for examination for normal comparison and control in clinical research program: Secondary | ICD-10-CM

## 2022-11-29 LAB — HELIX MOLECULAR SCREEN: Genetic Analysis Overall Interpretation: NEGATIVE

## 2022-11-29 LAB — GENECONNECT MOLECULAR SCREEN

## 2023-01-15 ENCOUNTER — Other Ambulatory Visit (HOSPITAL_COMMUNITY): Payer: Self-pay

## 2023-01-19 ENCOUNTER — Other Ambulatory Visit: Payer: Self-pay

## 2023-01-29 ENCOUNTER — Encounter (INDEPENDENT_AMBULATORY_CARE_PROVIDER_SITE_OTHER): Payer: Self-pay | Admitting: Vascular Surgery

## 2023-03-05 DIAGNOSIS — H5213 Myopia, bilateral: Secondary | ICD-10-CM | POA: Diagnosis not present

## 2023-03-26 DIAGNOSIS — H16103 Unspecified superficial keratitis, bilateral: Secondary | ICD-10-CM | POA: Diagnosis not present

## 2023-03-26 DIAGNOSIS — H04123 Dry eye syndrome of bilateral lacrimal glands: Secondary | ICD-10-CM | POA: Diagnosis not present

## 2023-04-15 ENCOUNTER — Other Ambulatory Visit: Payer: Self-pay | Admitting: Physician Assistant

## 2023-04-15 ENCOUNTER — Other Ambulatory Visit: Payer: Self-pay

## 2023-04-15 ENCOUNTER — Other Ambulatory Visit (HOSPITAL_COMMUNITY): Payer: Self-pay

## 2023-04-15 MED ORDER — OMEPRAZOLE 20 MG PO CPDR
20.0000 mg | DELAYED_RELEASE_CAPSULE | Freq: Two times a day (BID) | ORAL | 3 refills | Status: AC
  Filled 2023-04-15: qty 180, 90d supply, fill #0
  Filled 2023-07-14: qty 180, 90d supply, fill #1

## 2023-04-15 MED ORDER — LINACLOTIDE 290 MCG PO CAPS
290.0000 ug | ORAL_CAPSULE | Freq: Every day | ORAL | 3 refills | Status: AC
  Filled 2023-04-15: qty 90, 90d supply, fill #0
  Filled 2023-07-14: qty 90, 90d supply, fill #1
  Filled 2023-10-08 (×2): qty 90, 90d supply, fill #2

## 2023-04-21 ENCOUNTER — Telehealth: Payer: Self-pay | Admitting: Nurse Practitioner

## 2023-04-21 NOTE — Telephone Encounter (Signed)
 Vmtcb if an appt is needed.

## 2023-05-26 ENCOUNTER — Encounter (INDEPENDENT_AMBULATORY_CARE_PROVIDER_SITE_OTHER): Payer: Self-pay

## 2023-07-14 ENCOUNTER — Other Ambulatory Visit (HOSPITAL_COMMUNITY): Payer: Self-pay

## 2023-07-14 DIAGNOSIS — Z124 Encounter for screening for malignant neoplasm of cervix: Secondary | ICD-10-CM | POA: Diagnosis not present

## 2023-07-14 DIAGNOSIS — Z1329 Encounter for screening for other suspected endocrine disorder: Secondary | ICD-10-CM | POA: Diagnosis not present

## 2023-07-14 DIAGNOSIS — Z130 Encounter for screening for diseases of the blood and blood-forming organs and certain disorders involving the immune mechanism: Secondary | ICD-10-CM | POA: Diagnosis not present

## 2023-07-14 DIAGNOSIS — Z13228 Encounter for screening for other metabolic disorders: Secondary | ICD-10-CM | POA: Diagnosis not present

## 2023-07-14 DIAGNOSIS — Z01419 Encounter for gynecological examination (general) (routine) without abnormal findings: Secondary | ICD-10-CM | POA: Diagnosis not present

## 2023-07-14 DIAGNOSIS — Z1231 Encounter for screening mammogram for malignant neoplasm of breast: Secondary | ICD-10-CM | POA: Diagnosis not present

## 2023-07-14 DIAGNOSIS — Z1321 Encounter for screening for nutritional disorder: Secondary | ICD-10-CM | POA: Diagnosis not present

## 2023-07-15 DIAGNOSIS — R92323 Mammographic fibroglandular density, bilateral breasts: Secondary | ICD-10-CM | POA: Diagnosis not present

## 2023-07-15 DIAGNOSIS — Z1231 Encounter for screening mammogram for malignant neoplasm of breast: Secondary | ICD-10-CM | POA: Diagnosis not present

## 2023-07-31 ENCOUNTER — Other Ambulatory Visit (HOSPITAL_COMMUNITY): Payer: Self-pay

## 2023-10-08 ENCOUNTER — Other Ambulatory Visit: Payer: Self-pay

## 2023-10-08 ENCOUNTER — Other Ambulatory Visit (HOSPITAL_COMMUNITY): Payer: Self-pay

## 2024-01-04 ENCOUNTER — Other Ambulatory Visit: Payer: Self-pay

## 2024-01-04 ENCOUNTER — Other Ambulatory Visit (HOSPITAL_COMMUNITY): Payer: Self-pay

## 2024-01-04 MED ORDER — LINZESS 290 MCG PO CAPS
290.0000 ug | ORAL_CAPSULE | Freq: Every morning | ORAL | 1 refills | Status: DC
  Filled 2024-01-04: qty 90, 90d supply, fill #0

## 2024-01-04 MED ORDER — OMEPRAZOLE 20 MG PO CPDR
20.0000 mg | DELAYED_RELEASE_CAPSULE | Freq: Two times a day (BID) | ORAL | 1 refills | Status: AC
  Filled 2024-01-04: qty 180, 90d supply, fill #0

## 2024-01-05 ENCOUNTER — Other Ambulatory Visit (HOSPITAL_COMMUNITY): Payer: Self-pay

## 2024-01-05 ENCOUNTER — Other Ambulatory Visit: Payer: Self-pay

## 2024-01-19 ENCOUNTER — Other Ambulatory Visit (HOSPITAL_COMMUNITY): Payer: Self-pay

## 2024-01-19 MED ORDER — TRULANCE 3 MG PO TABS
1.0000 | ORAL_TABLET | Freq: Every day | ORAL | 1 refills | Status: AC
  Filled 2024-01-19: qty 30, 30d supply, fill #0
  Filled 2024-02-02: qty 90, 90d supply, fill #0

## 2024-02-01 ENCOUNTER — Other Ambulatory Visit (HOSPITAL_COMMUNITY): Payer: Self-pay

## 2024-02-01 ENCOUNTER — Other Ambulatory Visit: Payer: Self-pay

## 2024-02-02 ENCOUNTER — Other Ambulatory Visit (HOSPITAL_COMMUNITY): Payer: Self-pay

## 2024-02-02 ENCOUNTER — Encounter (HOSPITAL_COMMUNITY): Payer: Self-pay | Admitting: Pharmacist

## 2024-02-10 ENCOUNTER — Other Ambulatory Visit (HOSPITAL_COMMUNITY): Payer: Self-pay
